# Patient Record
Sex: Male | Born: 1960 | Race: White | Hispanic: No | Marital: Single | Smoking: Never smoker
Health system: Southern US, Community
[De-identification: ages and names within clinical notes are randomized; demographics above are authoritative.]

## PROBLEM LIST (undated history)

## (undated) DIAGNOSIS — I4891 Unspecified atrial fibrillation: Secondary | ICD-10-CM

## (undated) HISTORY — PX: APPENDECTOMY: SHX54

## (undated) HISTORY — PX: TMJ ARTHROPLASTY: SHX1066

---

## 2011-09-22 ENCOUNTER — Observation Stay: Payer: Self-pay | Admitting: Surgery

## 2011-09-22 LAB — URINALYSIS, COMPLETE
Bacteria: NONE SEEN
Bilirubin,UR: NEGATIVE
Blood: NEGATIVE
Ph: 5 (ref 4.5–8.0)
Protein: NEGATIVE
RBC,UR: 2 /HPF (ref 0–5)
Squamous Epithelial: NONE SEEN
WBC UR: 3 /HPF (ref 0–5)

## 2011-09-22 LAB — COMPREHENSIVE METABOLIC PANEL
Albumin: 3.8 g/dL (ref 3.4–5.0)
BUN: 19 mg/dL — ABNORMAL HIGH (ref 7–18)
Calcium, Total: 8.7 mg/dL (ref 8.5–10.1)
Co2: 30 mmol/L (ref 21–32)
Creatinine: 1.18 mg/dL (ref 0.60–1.30)
EGFR (African American): >60
Glucose: 106 mg/dL — ABNORMAL HIGH (ref 65–99)
SGOT(AST): 12 U/L — ABNORMAL LOW (ref 15–37)
SGPT (ALT): 19 U/L
Sodium: 140 mmol/L (ref 136–145)
Total Protein: 7 g/dL (ref 6.4–8.2)

## 2011-09-22 LAB — CBC
HCT: 47.4 % (ref 40.0–52.0)
HGB: 16.5 g/dL (ref 13.0–18.0)
MCH: 32.4 pg (ref 26.0–34.0)
RBC: 5.08 10*6/uL (ref 4.40–5.90)
WBC: 11.6 10*3/uL — ABNORMAL HIGH (ref 3.8–10.6)

## 2011-09-26 LAB — PATHOLOGY REPORT

## 2011-10-02 ENCOUNTER — Ambulatory Visit: Payer: Self-pay | Admitting: Surgery

## 2012-10-18 ENCOUNTER — Emergency Department: Payer: Self-pay | Admitting: Emergency Medicine

## 2012-10-18 LAB — COMPREHENSIVE METABOLIC PANEL
Albumin: 4.1 g/dL (ref 3.4–5.0)
Alkaline Phosphatase: 76 U/L (ref 50–136)
Anion Gap: 8 (ref 7–16)
BUN: 19 mg/dL — ABNORMAL HIGH (ref 7–18)
Bilirubin,Total: 0.5 mg/dL (ref 0.2–1.0)
Chloride: 108 mmol/L — ABNORMAL HIGH (ref 98–107)
Co2: 24 mmol/L (ref 21–32)
EGFR (Non-African Amer.): 55 — ABNORMAL LOW
SGOT(AST): 20 U/L (ref 15–37)
Total Protein: 7 g/dL (ref 6.4–8.2)

## 2012-10-18 LAB — URINALYSIS, COMPLETE
Bacteria: NONE SEEN
Bilirubin,UR: NEGATIVE
Nitrite: NEGATIVE
Ph: 6 (ref 4.5–8.0)
Squamous Epithelial: NONE SEEN

## 2012-10-18 LAB — CBC
HGB: 15.7 g/dL (ref 13.0–18.0)
MCH: 30.9 pg (ref 26.0–34.0)
MCHC: 33.9 g/dL (ref 32.0–36.0)
MCV: 91 fL (ref 80–100)
Platelet: 193 10*3/uL (ref 150–440)
RBC: 5.08 10*6/uL (ref 4.40–5.90)
WBC: 12 10*3/uL — ABNORMAL HIGH (ref 3.8–10.6)

## 2014-04-20 IMAGING — CT CT STONE STUDY
1 of 2 series · 16 of 32 positions shown, 20 images · non-contrast
Comparison: none

REASON FOR EXAM: flank pain
COMMENTS:

PROCEDURE:     CT  - CT ABDOMEN /PELVIS WO (STONE)  - October 18, 2012  [DATE]
RESULT:     History: Flank pain.
Comparison Study: CT of 09/22/2011.

[Series 2: 3mm soft tissue · axial · 0.73mm/px · z∈[-1005,-543]mm · 16 of 169 slices shown, 20 images]
[im 8/169  soft-tissue]
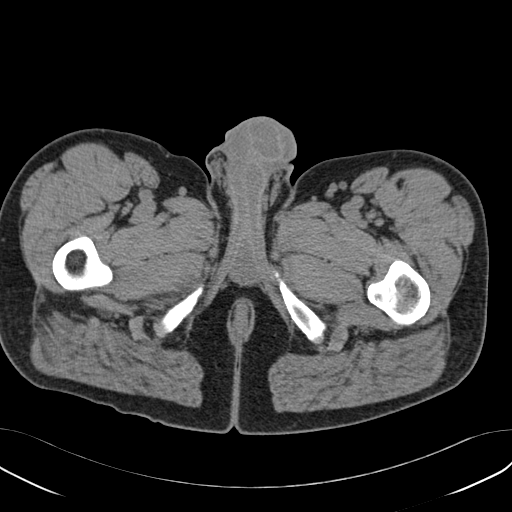
[im 8/169  bone]
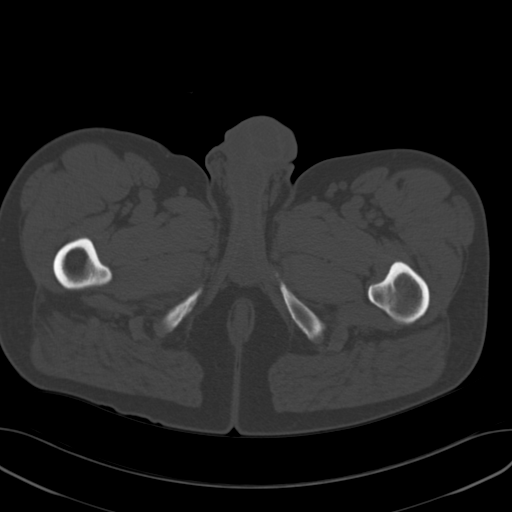
[im 22/169  soft-tissue]
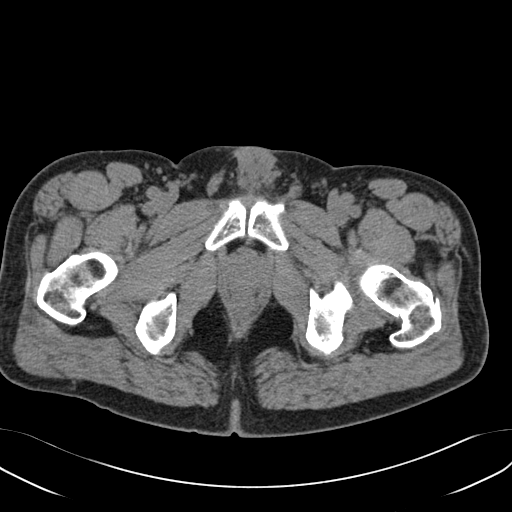
[im 36/169  soft-tissue]
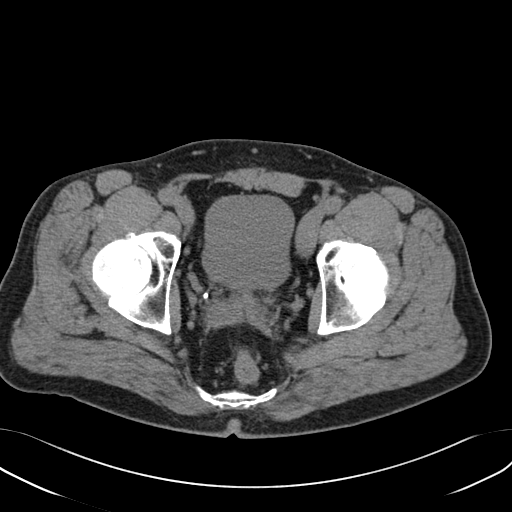
[im 43/169  soft-tissue]
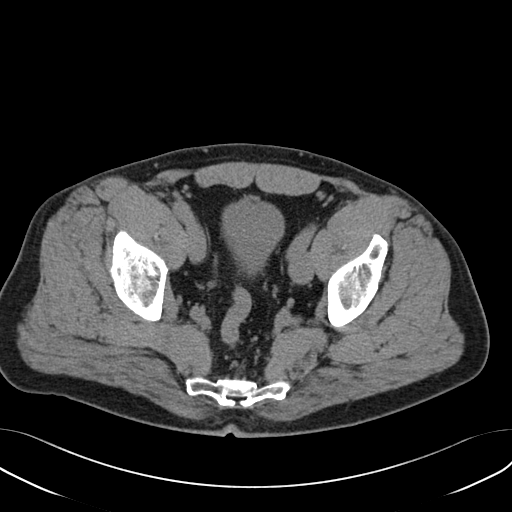
[im 57/169  soft-tissue]
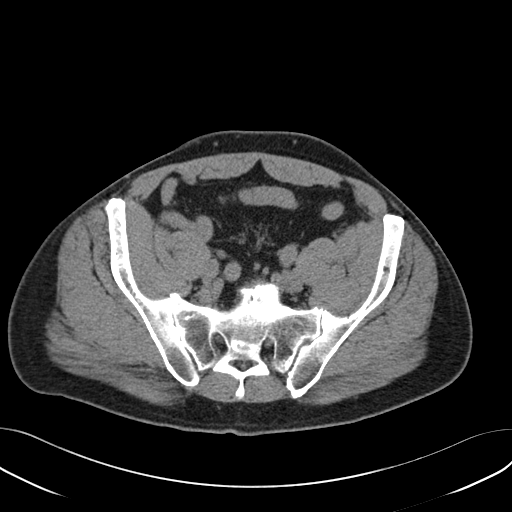
[im 71/169  soft-tissue]
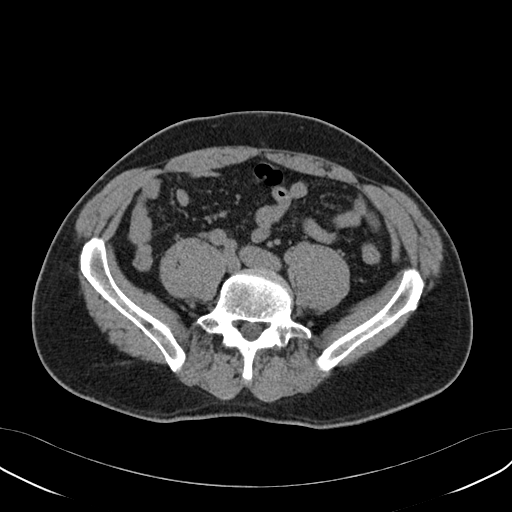
[im 78/169  soft-tissue]
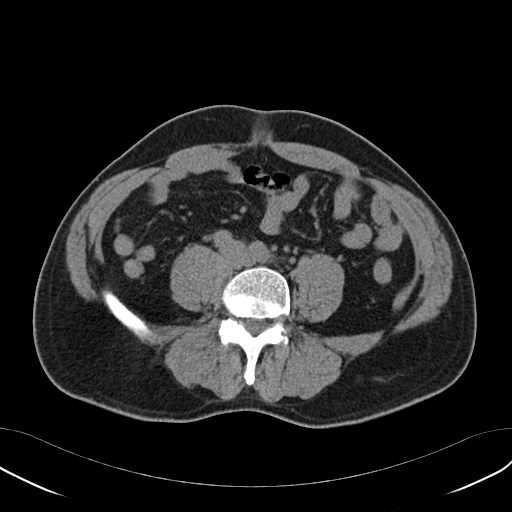
[im 92/169  soft-tissue]
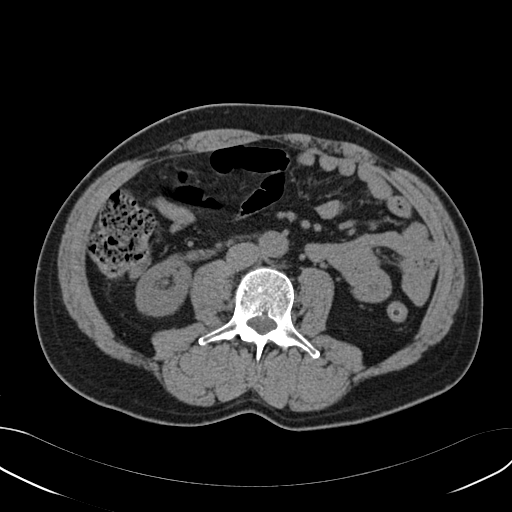
[im 99/169  soft-tissue]
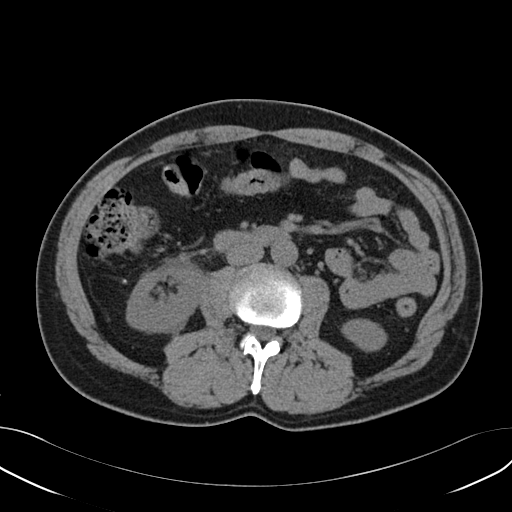
[im 99/169  bone]
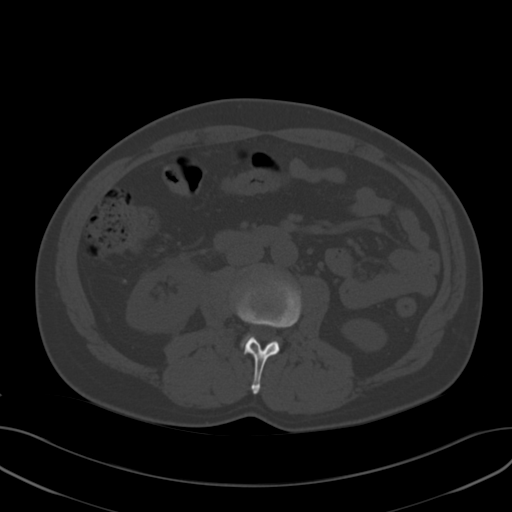
[im 113/169  soft-tissue]
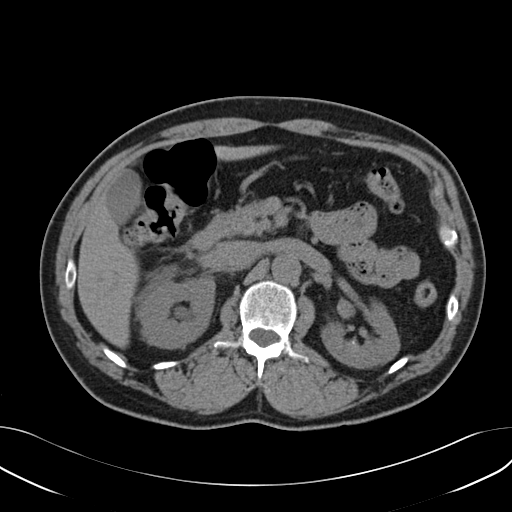
[im 127/169  soft-tissue]
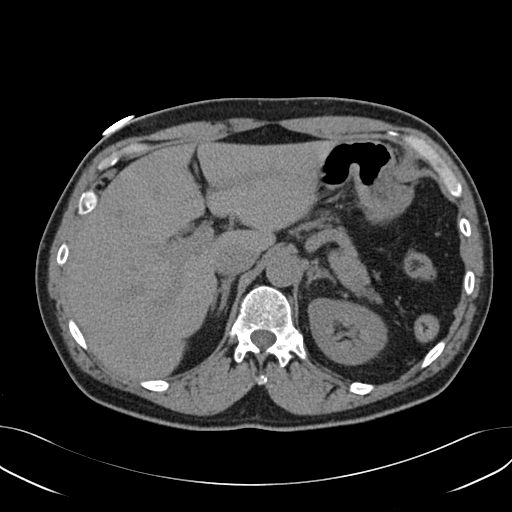
[im 134/169  soft-tissue]
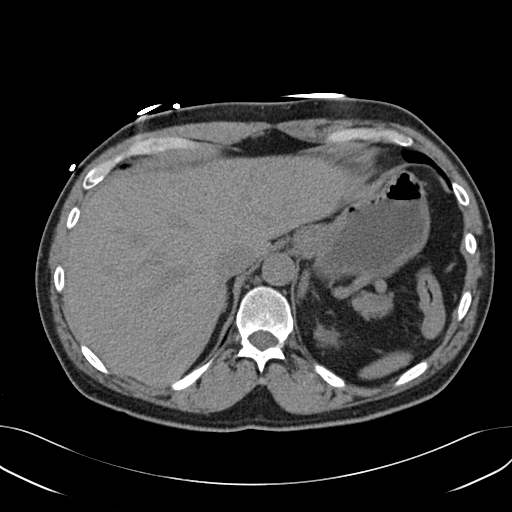
[im 141/169  lung]
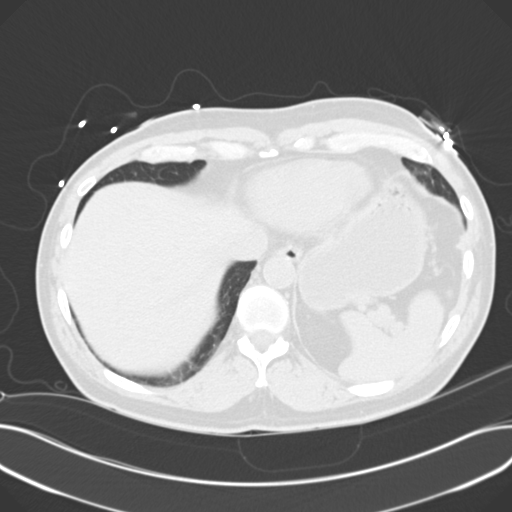
[im 148/169  soft-tissue]
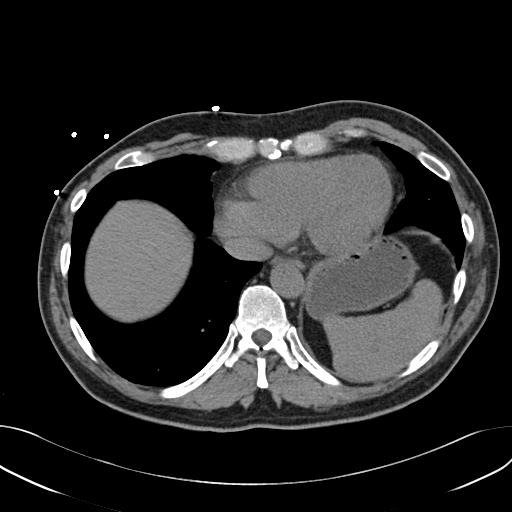
[im 148/169  lung]
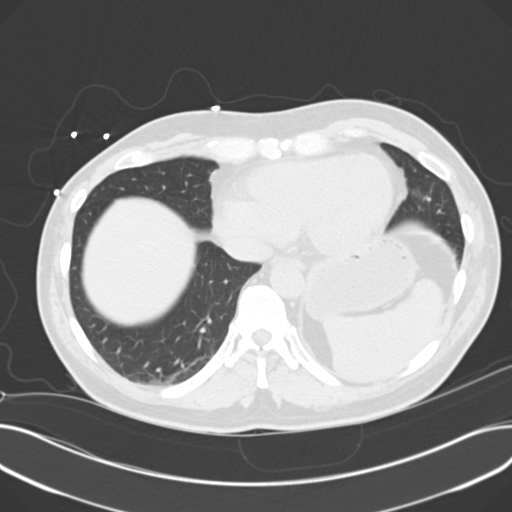
[im 155/169  lung]
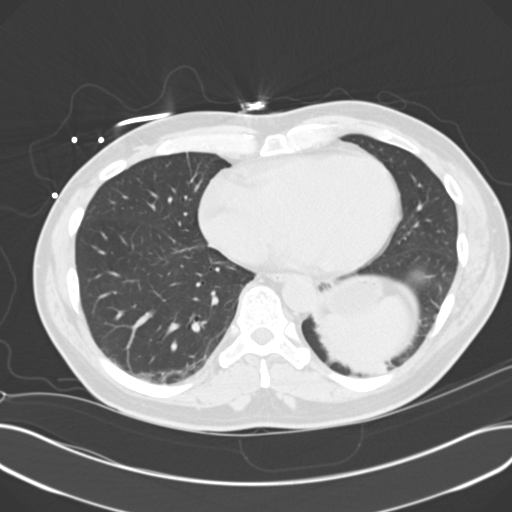
[im 162/169  soft-tissue]
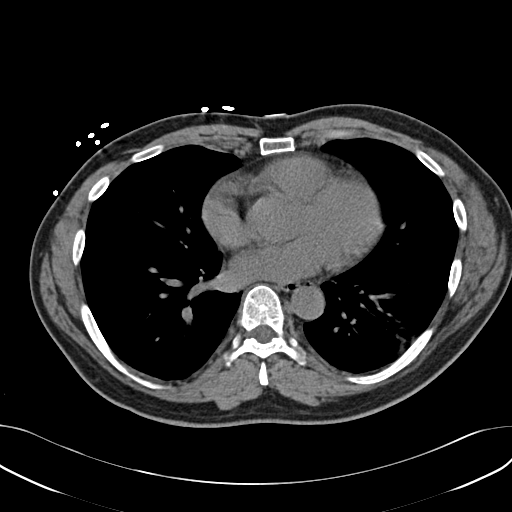
[im 162/169  lung]
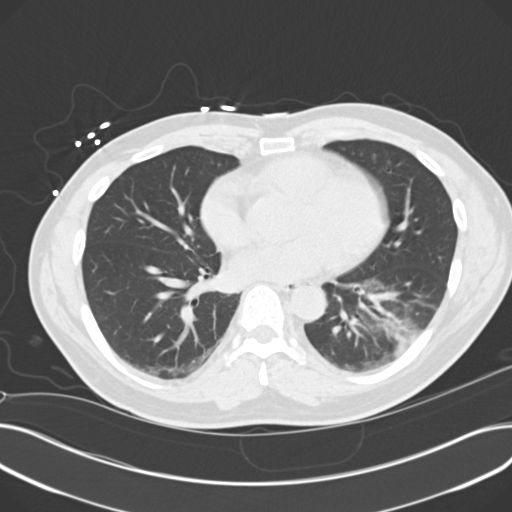

[16 of 32 positions shown; findings below may reference images not displayed]

FINDINGS: History: Flank pain.

Comparison Study: Prior CT of 09/22/2011.
FINDINGS: Standard nonenhanced CT obtained. Liver normal. Gallbladder
nondistended. Spleen normal. Pancreas normal. Adrenals normal. Prior
cholecystectomy. 2 mm stone in the distal right ureter with mild
hydronephrosis. Bladder is nondistended. Mild perirenal fluid on the right.
Aorta normal caliber. No bowel distention. Infiltrate noted left lung base.
IMPRESSION: 1. 2 mm stone distal right ureter on the right at the ureterovesical
junction. Minimal hydronephrosis present.
2. Left lower lobe pneumonia.

## 2015-01-02 NOTE — H&P (Signed)
  Subjective/Chief Complaint 2 days of abdominal pain    History of Present Illness 54 year old male with 2 days worsening abdominal pain fevers.  Symptoms started Thursday evening.  Unable to work Friday.  Temps at home to 100.8.  Mild nausea, significant anorexia.  Work-up in ER with leukocytosis and CT scan c/w acute appendicitis.    Past History paroxysmal atrial fibrillation.    Primary Physician non-local.   ALLERGIES:  Sulfa drugs: Unknown  Family and Social History:   Family History Non-Contributory    Social History negative tobacco, negative ETOH    Place of Living Home   Review of Systems:   Subjective/Chief Complaint as above    Abdominal Pain Yes    Nausea/Vomiting Yes    Tolerating Diet No  Nauseated  anorexia    Medications/Allergies Reviewed Medications/Allergies reviewed   Physical Exam:   GEN thin, disheveled, critically ill appearing, temp 99.2 hr 102/min.    HEENT pale conjunctivae    NECK supple    RESP normal resp effort  clear BS    CARD regular rate  no murmur  no JVD    ABD positive tenderness  no liver/spleen enlargement  no hernia  normal BS  significant rlq tenderness.    LYMPH negative neck    SKIN normal to palpation    NEURO cranial nerves intact    PSYCH A+O to time, place, person     Routine Chem:  12-Jan-13 08:40    Glucose, Serum 106   BUN 19   Creatinine (comp) 1.18   Sodium, Serum 140   Potassium, Serum 4.2   Chloride, Serum 102   CO2, Serum 30   Calcium (Total), Serum 8.7  Hepatic:  12-Jan-13 08:40    Bilirubin, Total 1.4   Alkaline Phosphatase 52   SGPT (ALT) 19   SGOT (AST) 12   Total Protein, Serum 7.0   Albumin, Serum 3.8  Routine Chem:  12-Jan-13 08:40    Osmolality (calc) 282   eGFR (African American) >60   eGFR (Non-African American) >60   Anion Gap 8  Routine Hem:  12-Jan-13 08:40    WBC (CBC) 11.6   RBC (CBC) 5.08   Hemoglobin (CBC) 16.5   Hematocrit (CBC) 47.4   Platelet Count (CBC)  212   MCV 93   MCH 32.4   MCHC 34.8   RDW 12.2  Routine Chem:  12-Jan-13 08:40    Lipase 55   Radiology Results: CT:    12-Jan-13 13:11, CT Abdomen and Pelvis With Contrast   CT Abdomen and Pelvis With Contrast   REASON FOR EXAM:    (1) RLQ pain; (2) RLQ pain  COMMENTS:       PROCEDURE: CT  - CT ABDOMEN / PELVIS  W  - Sep 22 2011  1:11PM     RESULT: Comparison:  None    Technique: Multiple axial images of the abdomen and pelvis were performed   from the lung bases to the pubic symphysis, with p.o. contrast and with   100 mL of Isovue 370 intravenous contrast.    Findings:  There are mild heterogeneous opacities in the lower lobes, left greater   than right. These may represent atelectasis. Infection is not excluded.   There is a 4 mm subpleural nodule in the right middle lobe on image 7.  There is mild dilatation of the intrahepatic biliary ducts. The common   bile duct measures 7-8 mm. The gallbladder, spleen, adrenals, and     pancreas are unremarkable. There is a 3 mm calculus in the inferior pole   of the right kidney. No hydronephrosis.    The small and large bowel are normal in caliber. The appendix is enlarged   and thickwalled. It measures 10-11 mm in diameter. There is adjacent   inflammatory stranding. No discrete extraluminal fluid collection. There   is mild bowel wall thickening of the terminal ileum, which may be   reactive.    No aggressive lytic or sclerotic osseous lesions are identified.    IMPRESSION:   1. Findings consistent with acute appendicitis.  2. Mild bowel wall thickening of the terminal ileum may be reactive.   However, an infectious or inflammatory enteritis is a differential   consideration.  3. Mild intrahepatic and extrahepatic biliary ductal dilatation. The   etiology of which is not apparent on this study. Further evaluation could   be provided with ERCP or M.R.C.P, as indicated.  4. Indeterminate 4 mm nodule in the right middle lobe.  If the patient is   at low risk for lung cancer, no further follow-upis recommended.  If the   patient is at high risk for lung cancer, recommend 12 month follow-up   noncontrast chest CT.    This was called to Dr. Lenise Arena at 1321 hours 09/22/2011.          Verified By: Gregor Hams, M.D., MD     Assessment/Admission Diagnosis 54 year old male with acute appendicitis    Plan npo, laparoscopic appendectomy.   Electronic Signatures: Sherri Rad (MD)  (Signed 12-Jan-13 14:33)  Authored: CHIEF COMPLAINT and HISTORY, ALLERGIES, HOME MEDICATIONS, FAMILY AND SOCIAL HISTORY, REVIEW OF SYSTEMS, PHYSICAL EXAM, LABS, Radiology, ASSESSMENT AND PLAN   Last Updated: 12-Jan-13 14:33 by Sherri Rad (MD)

## 2015-01-02 NOTE — Op Note (Signed)
PATIENT NAME:  Brandon Patton, Brandon Patton MR#:  [id number] DATE OF BIRTH:  [birth year]  DATE OF PROCEDURE:  09/22/2011  PREOPERATIVE DIAGNOSIS: Acute appendicitis.   POSTOPERATIVE DIAGNOSIS: Acute appendicitis.   PROCEDURE PERFORMED: Laparoscopic appendectomy.   SURGEON: Rollins A. Egbert GaribaldiBird, MD    ANESTHESIA: General endotracheal.   SPECIMENS: Appendix.   FINDINGS: Acute retrocecal appendicitis.   ESTIMATED BLOOD LOSS: Minimal.   DESCRIPTION OF PROCEDURE: With the patient in the supine position, general endotracheal anesthesia was induced. The patient's abdomen was clipped of hair, prepped and draped with ChloraPrep solution. SCDs were then applied. Perioperative antibiotics had been administered. Time-out procedure was observed. A 12 mm blunt Hassan trocar was placed through an open technique through an infraumbilical skin incision. Stay sutures were passed to the fascia. Pneumoperitoneum was established. A 12 mm Bladeless trocar was placed in the right upper quadrant. A 5 mm Bladeless trocar was placed in the suprapubic midline. The appendix was markedly inflamed and found to be in a retrocecal location and riding fairly high on the right lateral abdominal wall. The base of the appendix was dissected free with blunt technique and the base of the appendix was transected with a single fire of the blue load of the endoscopic 35 mm stapler. The mesentery was rotated medially with division of lateral attachments with blunt technique and the mesoappendix was taken with a single fire of the white load of the stapler. Specimen was captured in an EndoCatch device and retrieved. The right lower quadrant was irrigated with 1.5 liters of warm normal saline and aspirated dry. Point hemostasis was obtained in the epiploic fat pad was three hemoclips. With hemostasis being obtained on the operative field, the right upper quadrant port site was closed with an EndoStitch device. The infraumbilical fascial defect was closed with an  additional figure-of-eight #0 Vicryl suture in vertical orientation and the existing stay sutures tied to each other.   A total of 30 mL of 0.25% plain Marcaine was infiltrated along all skin and fascial incisions prior to closure. Benzoin, Steri-Strips, Telfa, and Tegaderm were then applied and the patient was then subsequently extubated and taken to the recovery room in stable and satisfactory condition by anesthesia services.   ____________________________ Redge GainerMark A. Egbert GaribaldiBird, MD mab:drc D: 09/22/2011 16:37:26 ET T: 09/23/2011 09:59:10 ET JOB#: [id number]  cc: Loraine LericheMark A. Egbert GaribaldiBird, MD, <Dictator> Raynald KempMARK A Onesimo Lingard MD ELECTRONICALLY SIGNED 09/28/2011 11:11

## 2015-11-23 ENCOUNTER — Other Ambulatory Visit: Payer: Self-pay | Admitting: Family Medicine

## 2015-11-23 DIAGNOSIS — N6320 Unspecified lump in the left breast, unspecified quadrant: Secondary | ICD-10-CM

## 2015-11-24 ENCOUNTER — Other Ambulatory Visit: Payer: Self-pay | Admitting: Family Medicine

## 2015-11-24 DIAGNOSIS — N63 Unspecified lump in unspecified breast: Secondary | ICD-10-CM

## 2015-12-05 ENCOUNTER — Ambulatory Visit
Admission: RE | Admit: 2015-12-05 | Discharge: 2015-12-05 | Disposition: A | Discharge status: Home / Self Care | Payer: BC Managed Care – PPO | Source: Ambulatory Visit | Attending: Family Medicine | Admitting: Family Medicine

## 2015-12-05 DIAGNOSIS — N6320 Unspecified lump in the left breast, unspecified quadrant: Secondary | ICD-10-CM

## 2015-12-05 DIAGNOSIS — N63 Unspecified lump in unspecified breast: Secondary | ICD-10-CM

## 2015-12-05 DIAGNOSIS — N62 Hypertrophy of breast: Secondary | ICD-10-CM | POA: Diagnosis not present

## 2020-04-12 ENCOUNTER — Other Ambulatory Visit: Payer: Self-pay

## 2020-04-12 ENCOUNTER — Inpatient Hospital Stay
Admission: EM | Admit: 2020-04-12 | Discharge: 2020-04-15 | DRG: 918 | Disposition: A | Discharge status: Home / Self Care | Payer: BC Managed Care – PPO | Attending: Student | Admitting: Student

## 2020-04-12 DIAGNOSIS — Y92009 Unspecified place in unspecified non-institutional (private) residence as the place of occurrence of the external cause: Secondary | ICD-10-CM

## 2020-04-12 DIAGNOSIS — I4891 Unspecified atrial fibrillation: Secondary | ICD-10-CM | POA: Diagnosis not present

## 2020-04-12 DIAGNOSIS — I480 Paroxysmal atrial fibrillation: Secondary | ICD-10-CM | POA: Diagnosis present

## 2020-04-12 DIAGNOSIS — W5911XA Bitten by nonvenomous snake, initial encounter: Secondary | ICD-10-CM | POA: Diagnosis present

## 2020-04-12 DIAGNOSIS — Z20822 Contact with and (suspected) exposure to covid-19: Secondary | ICD-10-CM | POA: Diagnosis present

## 2020-04-12 DIAGNOSIS — M7989 Other specified soft tissue disorders: Secondary | ICD-10-CM | POA: Diagnosis present

## 2020-04-12 DIAGNOSIS — M79641 Pain in right hand: Secondary | ICD-10-CM | POA: Diagnosis present

## 2020-04-12 DIAGNOSIS — T63081A Toxic effect of venom of other African and Asian snake, accidental (unintentional), initial encounter: Principal | ICD-10-CM | POA: Diagnosis present

## 2020-04-12 DIAGNOSIS — K5903 Drug induced constipation: Secondary | ICD-10-CM | POA: Diagnosis not present

## 2020-04-12 DIAGNOSIS — T402X5A Adverse effect of other opioids, initial encounter: Secondary | ICD-10-CM | POA: Diagnosis present

## 2020-04-12 HISTORY — DX: Unspecified atrial fibrillation: I48.91

## 2020-04-12 LAB — CBC WITH DIFFERENTIAL/PLATELET
Abs Immature Granulocytes: 0.03 10*3/uL (ref 0.00–0.07)
Abs Immature Granulocytes: 0.03 10*3/uL (ref 0.00–0.07)
Basophils Absolute: 0.1 10*3/uL (ref 0.0–0.1)
Basophils Absolute: 0 10*3/uL (ref 0.0–0.1)
Basophils Relative: 1 %
Basophils Relative: 0 %
Eosinophils Absolute: 0.2 10*3/uL (ref 0.0–0.5)
Eosinophils Absolute: 0.1 10*3/uL (ref 0.0–0.5)
Eosinophils Relative: 2 %
Eosinophils Relative: 1 %
HCT: 45.4 % (ref 39.0–52.0)
HCT: 43.1 % (ref 39.0–52.0)
Hemoglobin: 15.6 g/dL (ref 13.0–17.0)
Hemoglobin: 15.1 g/dL (ref 13.0–17.0)
Immature Granulocytes: 0 %
Immature Granulocytes: 0 %
Lymphocytes Relative: 19 %
Lymphocytes Relative: 8 %
Lymphs Abs: 1.4 10*3/uL (ref 0.7–4.0)
Lymphs Abs: 0.7 10*3/uL (ref 0.7–4.0)
MCH: 31.3 pg (ref 26.0–34.0)
MCH: 31.7 pg (ref 26.0–34.0)
MCHC: 34.4 g/dL (ref 30.0–36.0)
MCHC: 35 g/dL (ref 30.0–36.0)
MCV: 91 fL (ref 80.0–100.0)
MCV: 90.5 fL (ref 80.0–100.0)
Monocytes Absolute: 0.6 10*3/uL (ref 0.1–1.0)
Monocytes Absolute: 0.4 10*3/uL (ref 0.1–1.0)
Monocytes Relative: 8 %
Monocytes Relative: 4 %
Neutro Abs: 5.1 10*3/uL (ref 1.7–7.7)
Neutro Abs: 7.6 10*3/uL (ref 1.7–7.7)
Neutrophils Relative %: 70 %
Neutrophils Relative %: 87 %
Platelets: 218 10*3/uL (ref 150–400)
Platelets: 197 10*3/uL (ref 150–400)
RBC: 4.99 MIL/uL (ref 4.22–5.81)
RBC: 4.76 MIL/uL (ref 4.22–5.81)
RDW: 12.6 % (ref 11.5–15.5)
RDW: 12.6 % (ref 11.5–15.5)
WBC: 7.3 10*3/uL (ref 4.0–10.5)
WBC: 8.7 10*3/uL (ref 4.0–10.5)
nRBC: 0 % (ref 0.0–0.2)
nRBC: 0 % (ref 0.0–0.2)

## 2020-04-12 LAB — TYPE AND SCREEN
ABO/RH(D): O POS
Antibody Screen: NEGATIVE

## 2020-04-12 LAB — PROTIME-INR
INR: 0.9 (ref 0.8–1.2)
INR: 1.1 (ref 0.8–1.2)
INR: 1.1 (ref 0.8–1.2)
Prothrombin Time: 12.2 seconds (ref 11.4–15.2)
Prothrombin Time: 13.3 seconds (ref 11.4–15.2)
Prothrombin Time: 13.4 seconds (ref 11.4–15.2)

## 2020-04-12 LAB — COMPREHENSIVE METABOLIC PANEL
ALT: 22 U/L (ref 0–44)
AST: 18 U/L (ref 15–41)
Albumin: 4.7 g/dL (ref 3.5–5.0)
Alkaline Phosphatase: 79 U/L (ref 38–126)
Anion gap: 10 (ref 5–15)
BUN: 21 mg/dL — ABNORMAL HIGH (ref 6–20)
CO2: 24 mmol/L (ref 22–32)
Calcium: 9 mg/dL (ref 8.9–10.3)
Chloride: 106 mmol/L (ref 98–111)
Creatinine, Ser: 1.02 mg/dL (ref 0.61–1.24)
GFR calc Af Amer: >60 mL/min (ref >60)
GFR calc non Af Amer: >60 mL/min (ref >60)
Glucose, Bld: 103 mg/dL — ABNORMAL HIGH (ref 70–99)
Potassium: 4 mmol/L (ref 3.5–5.1)
Sodium: 140 mmol/L (ref 135–145)
Total Bilirubin: 0.8 mg/dL (ref 0.3–1.2)
Total Protein: 7.1 g/dL (ref 6.5–8.1)

## 2020-04-12 LAB — SARS CORONAVIRUS 2 BY RT PCR (HOSPITAL ORDER, PERFORMED IN ~~LOC~~ HOSPITAL LAB): SARS Coronavirus 2: NEGATIVE

## 2020-04-12 LAB — FIBRINOGEN
Fibrinogen: 461 mg/dL (ref 210–475)
Fibrinogen: 385 mg/dL (ref 210–475)

## 2020-04-12 LAB — LACTATE DEHYDROGENASE: LDH: 139 U/L (ref 98–192)

## 2020-04-12 LAB — APTT
aPTT: 24 seconds (ref 24–36)
aPTT: <24 seconds — ABNORMAL LOW (ref 24–36)

## 2020-04-12 MED ORDER — MORPHINE SULFATE (PF) 4 MG/ML IV SOLN
4.0000 mg | INTRAVENOUS | Status: DC | PRN
  Administered 2020-04-12: 4 mg via INTRAVENOUS
  Filled 2020-04-12: qty 1

## 2020-04-12 MED ORDER — HYDROMORPHONE HCL 1 MG/ML IJ SOLN
1.0000 mg | INTRAMUSCULAR | Status: DC | PRN
  Administered 2020-04-12 – 2020-04-15 (×14): 1 mg via INTRAVENOUS
  Filled 2020-04-12 (×14): qty 1

## 2020-04-12 MED ORDER — HYDROMORPHONE HCL 1 MG/ML IJ SOLN
0.5000 mg | Freq: Once | INTRAMUSCULAR | Status: AC
  Administered 2020-04-12: 0.5 mg via INTRAVENOUS
  Filled 2020-04-12: qty 1

## 2020-04-12 MED ORDER — MORPHINE SULFATE (PF) 4 MG/ML IV SOLN: 4.0000 mg | Freq: Once | INTRAVENOUS | Status: DC

## 2020-04-12 MED ORDER — SODIUM CHLORIDE 0.9 % IV SOLN: INTRAVENOUS | Status: DC

## 2020-04-12 NOTE — ED Notes (Signed)
Pt reports pain radiating up arm at this time

## 2020-04-12 NOTE — ED Triage Notes (Signed)
Pt arrives via pov. Pt reports obtaining snake bite by african bush viper on right hand index finger at 1630 today. Area red swollen and warm to touch. Cap refill WNL. Pt reports hx of multiple episodes of afib in past, pt take diltiazem and flecanide. Pt a&o on arrival NAD noted at this time

## 2020-04-12 NOTE — ED Notes (Signed)
Poison control recommends 24 hr obs with repeat coag q6, more frequently if changes noted. Recommended monitor for bleeding and treat with ffp or blood products if needed.  Also recommended against morphine due to increased histamine release, and recommends against NSAIDS due to increased bleeding risk

## 2020-04-12 NOTE — ED Notes (Signed)
Pt reports throbbing has transitioned from just finger to entire hand. MD notified, orders received

## 2020-04-12 NOTE — ED Provider Notes (Signed)
[facility] Emergency Department Provider Note   ____________________________________________    I have reviewed the triage vital signs and the nursing notes.   HISTORY  Chief Complaint Snake Bite    HPI Brandon Patton is a 59 y.o. male with a history of atrial fibrillation who presents after snakebite to the right hand by an African Bush viper.  Patient was apparently trying to take a picture of this exotic species that he keeps and it bit him.  It was an adult.  He does complain of pain and swelling to the area where he was bitten.  Denies shortness of breath, no nausea or vomiting.  Bite happened at 4:30 PM   Past Medical History:  Diagnosis Date  . A-fib (HCC)     There are no problems to display for this patient.   Past Surgical History:  Procedure Laterality Date  . APPENDECTOMY    . TMJ ARTHROPLASTY      Prior to Admission medications   Not on File     Allergies Penicillins and Sulfa antibiotics  Family History  Problem Relation Age of Onset  . Breast cancer Paternal Aunt   . Breast cancer Paternal Grandmother   . Breast cancer Paternal Aunt     Social History Social History   Tobacco Use  . Smoking status: Never Smoker  . Smokeless tobacco: Never Used  Substance Use Topics  . Alcohol use: Never  . Drug use: Never    Review of Systems  Constitutional: No fever/chills Eyes: No visual changes.  ENT: No sore throat. Cardiovascular: Denies chest pain. Respiratory: Denies shortness of breath. Gastrointestinal: No abdominal pain.   Genitourinary: Negative for dysuria. Musculoskeletal: Negative for back pain. Skin: As above Neurological: Negative for headaches or weakness   ____________________________________________   PHYSICAL EXAM:  VITAL SIGNS: ED Triage Vitals  Enc Vitals Group     BP 04/12/20 1718 (!) 136/103     Pulse Rate 04/12/20 1718 100     Resp 04/12/20 1718 13     Temp 04/12/20 1718 98 F  (36.7 C)     Temp Source 04/12/20 1718 Oral     SpO2 04/12/20 1718 94 %     Weight 04/12/20 1720 81.6 kg (180 lb)     Height 04/12/20 1720 1.778 m (5\' 10" )     Head Circumference --      Peak Flow --      Pain Score 04/12/20 1719 8     Pain Loc --      Pain Edu? --      Excl. in GC? --     Constitutional: Alert and oriented.   Nose: No congestion/rhinnorhea. Mouth/Throat: Mucous membranes are moist.   Neck:  Painless ROM Cardiovascular:  Good peripheral circulation. Respiratory: Normal respiratory effort.  No retractions. Gastrointestinal: Soft and nontender. No distention.   Musculoskeletal:  Warm and well perfused Neurologic:  Normal speech and language. No gross focal neurologic deficits are appreciated.  Skin:  Skin is warm, dry.  06/12/20 marks with swelling of the base of the right index finger on the dorsal aspect Psychiatric: Mood and affect are normal. Speech and behavior are normal.  ____________________________________________   LABS (all labs ordered are listed, but only abnormal results are displayed)  Labs Reviewed  COMPREHENSIVE METABOLIC PANEL - Abnormal; Notable for the following components:      Result Value   Glucose, Bld 103 (*)    BUN 21 (*)  All other components within normal limits  SARS CORONAVIRUS 2 BY RT PCR (HOSPITAL ORDER, PERFORMED IN [facility])  CBC WITH DIFFERENTIAL/PLATELET  PROTIME-INR  FIBRINOGEN  APTT  LACTATE DEHYDROGENASE  CBC WITH DIFFERENTIAL/PLATELET  CBC WITH DIFFERENTIAL/PLATELET  PROTIME-INR  PROTIME-INR  FIBRINOGEN  FIBRINOGEN   ____________________________________________  EKG  ED ECG REPORT I, Jene Every, the attending physician, personally viewed and interpreted this ECG.  Date: 04/12/2020  Rhythm: Sinus tachycardia QRS Axis: normal Intervals: normal ST/T Wave abnormalities: normal Narrative Interpretation: no evidence of acute  ischemia  ____________________________________________  RADIOLOGY  None ____________________________________________   PROCEDURES  Procedure(s) performed: No  Procedures   Critical Care performed: No ____________________________________________   INITIAL IMPRESSION / ASSESSMENT AND PLAN / ED COURSE  Pertinent labs & imaging results that were available during my care of the patient were reviewed by me and considered in my medical decision making (see chart for details).  Patient presents after snakebite by African Bush viper.  There is no known antivenom for this.  Patient does have pronounced of recommended treatments which are primarily supportive.  I have drawn labs, placed IV, IV morphine for pain as needed.  IV fluids infusing.  Have discussed with poison control center, they are consulting toxicology to see if there are any antivenom's that would be effective in the area.  They recommend admission for 24-hour evaluation  Patient will require admission for observation given the hemotoxic nature of this event him    ____________________________________________   FINAL CLINICAL IMPRESSION(S) / ED DIAGNOSES  Final diagnoses:  Snake bite, initial encounter        Note:  This document was prepared using Dragon voice recognition software and may include unintentional dictation errors.   Jene Every, MD 04/12/20 Zollie Pee

## 2020-04-12 NOTE — ED Notes (Signed)
Poison control contacted at this time

## 2020-04-12 NOTE — H&P (Signed)
History and Physical   [name] QBH:[id number] DOB: 1961/09/05 DOA: 04/12/2020  Referring MD/NP/PA: Dr. Cyril Loosen  PCP: Myrla Halsted, MD   Outpatient Specialists: None  Patient coming from: Home  Chief Complaint: Snakebite  HPI: Brandon Patton is a 59 y.o. male with medical history significant of atrial fibrillation, previous appendectomy, who presented to the ER today after has sustaining snakebite by an African boy BiPAP.  This is negative was apparently part of an exotic pet at her residence.  Patient was trying to take pictures of the snake when he suddenly bit him in the right hand.  The significance neck bites marks on the right index finger.  The whole hand is swollen in the moment.  Patient came to the ER where he was seen and evaluated.  No bleeding episode no nausea vomiting or diarrhea.  Poison control has been contacted.  There is no significant [place] available.  Patient will not respond to [employer].  Is being admitted for at least 24-hour observation and further treatment..  ED Course: Vitals are stable.  Labs ulcers mostly stable.  PT 12.2 INR 0.9..  Repeat INR is 1.1.  Patient will be admitted with plan to get intervened on.  Review of Systems: As per HPI otherwise 10 point review of systems negative.    Past Medical History:  Diagnosis Date  . A-fib [facility])     Past Surgical History:  Procedure Laterality Date  . APPENDECTOMY    . TMJ ARTHROPLASTY       reports that he has never smoked. He has never used smokeless tobacco. He reports that he does not drink alcohol and does not use drugs.  Allergies  Allergen Reactions  . Penicillins   . Sulfa Antibiotics Other (See Comments)    blisters    Family History  Problem Relation Age of Onset  . Breast cancer Paternal Aunt   . Breast cancer Paternal Grandmother   . Breast cancer Paternal Aunt      Prior to Admission medications   Medication Sig Start Date End Date Taking? Authorizing Provider  aspirin  81 MG chewable tablet Chew 81 mg by mouth daily. 01/29/17  Yes [provider]  atorvastatin (LIPITOR) 40 MG tablet Take 40 mg by mouth daily. 01/22/20   [provider]  flecainide (TAMBOCOR) 50 MG tablet Take 50 mg by mouth every 8 (eight) hours as needed. 03/11/20   [provider]  Multiple Vitamin (MULTI-VITAMIN) tablet Take 1 tablet by mouth daily.    [provider]  tadalafil (CIALIS) 20 MG tablet Take 20 mg by mouth daily as needed for erectile dysfunction. 03/18/20   [provider]  TIADYLT ER 120 MG 24 hr capsule Take 120 mg by mouth daily. 01/22/20   [provider]    Physical Exam: Vitals:   04/12/20 1718 04/12/20 1720  BP: (!) 136/103   Pulse: 100   Resp: 13   Temp: 98 F (36.7 C)   TempSrc: Oral   SpO2: 94%   Weight:  81.6 kg  Height:  5\' 10"  (1.778 m)      Constitutional: Acutely ill looking, withdrawn Vitals:   04/12/20 1718 04/12/20 1720  BP: (!) 136/103   Pulse: 100   Resp: 13   Temp: 98 F (36.7 C)   TempSrc: Oral   SpO2: 94%   Weight:  81.6 kg  Height:  5\' 10"  (1.778 m)   Eyes: PERRL, lids and conjunctivae normal ENMT: Mucous membranes are  moist. Posterior pharynx clear of any exudate or lesions.Normal dentition.  Neck: normal, supple, no masses, no thyromegaly Respiratory: clear to auscultation bilaterally, no wheezing, no crackles. Normal respiratory effort. No accessory muscle use.  Cardiovascular: Regular rate and rhythm, no murmurs / rubs / gallops. No extremity edema. 2+ pedal pulses. No carotid bruits.  Abdomen: no tenderness, no masses palpated. No hepatosplenomegaly. Bowel sounds positive.  Musculoskeletal: no clubbing / cyanosis. No joint deformity upper and lower extremities.  Right hand swollen warm inflamed around the right index finger.  Skin: no rashes, lesions, ulcers. No induration Neurologic: CN 2-12 grossly intact. Sensation intact, DTR normal. Strength 5/5 in all 4.  Psychiatric:  Normal judgment and insight. Alert and oriented x 3. Normal mood.     Labs on Admission: I have personally reviewed following labs and imaging studies  CBC: Recent Labs  Lab 04/12/20 1728  WBC 7.3  NEUTROABS 5.1  HGB 15.6  HCT 45.4  MCV 91.0  PLT 218   Basic Metabolic Panel: Recent Labs  Lab 04/12/20 1728  NA 140  K 4.0  CL 106  CO2 24  GLUCOSE 103*  BUN 21*  CREATININE 1.02  CALCIUM 9.0   GFR: Estimated Creatinine Clearance: 80.5 mL/min (by C-G formula based on SCr of 1.02 mg/dL). Liver Function Tests: Recent Labs  Lab 04/12/20 1728  AST 18  ALT 22  ALKPHOS 79  BILITOT 0.8  PROT 7.1  ALBUMIN 4.7   No results for input(s): LIPASE, AMYLASE in the last 168 hours. No results for input(s): AMMONIA in the last 168 hours. Coagulation Profile: Recent Labs  Lab 04/12/20 1728  INR 0.9   Cardiac Enzymes: No results for input(s): CKTOTAL, CKMB, CKMBINDEX, TROPONINI in the last 168 hours. BNP (last 3 results) No results for input(s): PROBNP in the last 8760 hours. HbA1C: No results for input(s): HGBA1C in the last 72 hours. CBG: No results for input(s): GLUCAP in the last 168 hours. Lipid Profile: No results for input(s): CHOL, HDL, LDLCALC, TRIG, CHOLHDL, LDLDIRECT in the last 72 hours. Thyroid Function Tests: No results for input(s): TSH, T4TOTAL, FREET4, T3FREE, THYROIDAB in the last 72 hours. Anemia Panel: No results for input(s): VITAMINB12, FOLATE, FERRITIN, TIBC, IRON, RETICCTPCT in the last 72 hours. Urine analysis:    Component Value Date/Time   COLORURINE Colorless 10/18/2012 1125   APPEARANCEUR Clear 10/18/2012 1125   LABSPEC 1.025 10/18/2012 1125   PHURINE 6.0 10/18/2012 1125   GLUCOSEU Negative 10/18/2012 1125   HGBUR 2+ 10/18/2012 1125   BILIRUBINUR Negative 10/18/2012 1125   KETONESUR Negative 10/18/2012 1125   PROTEINUR Negative 10/18/2012 1125   NITRITE Negative 10/18/2012 1125   LEUKOCYTESUR Negative 10/18/2012 1125   Sepsis  Labs: @LABRCNTIP (procalcitonin:4,lacticidven:4) )No results found for this or any previous visit (from the past 240 hour(s)).   Radiological Exams on Admission: No results found.  EKG: Independently reviewed.  Normal sinus rhythm no significant ST changes  Assessment/Plan Principal Problem:   Bite, snake Active Problems:   A-fib (HCC)     #1 snakebite: Bitten by Bush viper.  Patient will be admitted.  Pain control.  Elevation of the foot.  There is no specific antivenom although through poison control hepatologist and toxicologist have been contacted.  There seems to be suggestion of an anti venom that may walk against this poison.  We have discussed with poison control and apparently the main concern is coagulopathy.  There is the suggestion of a "Samr echiscarinatus antivenom" available only at [facility], the closest one to [place] being in [place].  There is a contact person named Janus Molder who is the hematologist that have access to 5 vials of the antivenom.  Her telephone number is [phone] or cell phone [phone]. recommendation is if INR appears to be getting worse we will obtain these vials to be on the safe side and administer to the patient.  If we do not use it we will then send it back to the hepatologist.    #2 history of atrial fibrillation: Has been in sinus rhythm at this point.  Continue monitor   DVT prophylaxis: SCD Code Status: Full code Family Communication: No family at bedside Disposition Plan: Home Consults called: Poison control Admission status: Inpatient  Severity of Illness: The appropriate patient status for this patient is INPATIENT. Inpatient status is judged to be reasonable and necessary in order to provide the required intensity of service to ensure the patient's safety. The patient's presenting symptoms, physical exam findings, and initial radiographic and laboratory data in the context of their chronic comorbidities is  felt to place them at high risk for further clinical deterioration. Furthermore, it is not anticipated that the patient will be medically stable for discharge from the hospital within 2 midnights of admission. The following factors support the patient status of inpatient.   " The patient's presenting symptoms include snakebite. " The worrisome physical exam findings include markedly elevated and swollen right hand. " The initial radiographic and laboratory data are worrisome because of no significant derangement. " The chronic co-morbidities include history of A. fib.   * I certify that at the point of admission it is my clinical judgment that the patient will require inpatient hospital care spanning beyond 2 midnights from the point of admission due to high intensity of service, high risk for further deterioration and high frequency of surveillance required.Lonia Blood MD [facility] Pager [phone]  If 7PM-7AM, please contact night-coverage [email or web address] Password [email or web address]  04/12/2020, 7:34 PM

## 2020-04-13 DIAGNOSIS — I480 Paroxysmal atrial fibrillation: Secondary | ICD-10-CM

## 2020-04-13 LAB — FIBRINOGEN
Fibrinogen: 306 mg/dL (ref 210–475)
Fibrinogen: 222 mg/dL (ref 210–475)
Fibrinogen: 181 mg/dL — ABNORMAL LOW (ref 210–475)
Fibrinogen: 141 mg/dL — ABNORMAL LOW (ref 210–475)
Fibrinogen: 105 mg/dL — ABNORMAL LOW (ref 210–475)

## 2020-04-13 LAB — CBC WITH DIFFERENTIAL/PLATELET
Abs Immature Granulocytes: 0.04 10*3/uL (ref 0.00–0.07)
Basophils Absolute: 0 10*3/uL (ref 0.0–0.1)
Basophils Relative: 0 %
Eosinophils Absolute: 0 10*3/uL (ref 0.0–0.5)
Eosinophils Relative: 0 %
HCT: 41.1 % (ref 39.0–52.0)
Hemoglobin: 14.7 g/dL (ref 13.0–17.0)
Immature Granulocytes: 0 %
Lymphocytes Relative: 4 %
Lymphs Abs: 0.5 10*3/uL — ABNORMAL LOW (ref 0.7–4.0)
MCH: 32 pg (ref 26.0–34.0)
MCHC: 35.8 g/dL (ref 30.0–36.0)
MCV: 89.5 fL (ref 80.0–100.0)
Monocytes Absolute: 1 10*3/uL (ref 0.1–1.0)
Monocytes Relative: 8 %
Neutro Abs: 10.6 10*3/uL — ABNORMAL HIGH (ref 1.7–7.7)
Neutrophils Relative %: 88 %
Platelets: 174 10*3/uL (ref 150–400)
RBC: 4.59 MIL/uL (ref 4.22–5.81)
RDW: 13 % (ref 11.5–15.5)
WBC: 12.1 10*3/uL — ABNORMAL HIGH (ref 4.0–10.5)
nRBC: 0 % (ref 0.0–0.2)

## 2020-04-13 LAB — PROTIME-INR
INR: 1.2 (ref 0.8–1.2)
INR: 1.1 (ref 0.8–1.2)
INR: 1.2 (ref 0.8–1.2)
INR: 1.2 (ref 0.8–1.2)
INR: 1.2 (ref 0.8–1.2)
Prothrombin Time: 14.3 seconds (ref 11.4–15.2)
Prothrombin Time: 14 seconds (ref 11.4–15.2)
Prothrombin Time: 14.3 seconds (ref 11.4–15.2)
Prothrombin Time: 14.6 seconds (ref 11.4–15.2)
Prothrombin Time: 14.6 seconds (ref 11.4–15.2)

## 2020-04-13 LAB — CBC
HCT: 38.4 % — ABNORMAL LOW (ref 39.0–52.0)
HCT: 38.8 % — ABNORMAL LOW (ref 39.0–52.0)
HCT: 39.4 % (ref 39.0–52.0)
Hemoglobin: 13.7 g/dL (ref 13.0–17.0)
Hemoglobin: 13.1 g/dL (ref 13.0–17.0)
Hemoglobin: 13.4 g/dL (ref 13.0–17.0)
MCH: 32.2 pg (ref 26.0–34.0)
MCH: 31.3 pg (ref 26.0–34.0)
MCH: 31.7 pg (ref 26.0–34.0)
MCHC: 35.7 g/dL (ref 30.0–36.0)
MCHC: 33.8 g/dL (ref 30.0–36.0)
MCHC: 34 g/dL (ref 30.0–36.0)
MCV: 90.1 fL (ref 80.0–100.0)
MCV: 92.6 fL (ref 80.0–100.0)
MCV: 93.1 fL (ref 80.0–100.0)
Platelets: 167 10*3/uL (ref 150–400)
Platelets: 166 10*3/uL (ref 150–400)
Platelets: 170 10*3/uL (ref 150–400)
RBC: 4.26 MIL/uL (ref 4.22–5.81)
RBC: 4.19 MIL/uL — ABNORMAL LOW (ref 4.22–5.81)
RBC: 4.23 MIL/uL (ref 4.22–5.81)
RDW: 13.1 % (ref 11.5–15.5)
RDW: 13.2 % (ref 11.5–15.5)
RDW: 13.2 % (ref 11.5–15.5)
WBC: 8.6 10*3/uL (ref 4.0–10.5)
WBC: 6.4 10*3/uL (ref 4.0–10.5)
WBC: 7.5 10*3/uL (ref 4.0–10.5)
nRBC: 0 % (ref 0.0–0.2)
nRBC: 0 % (ref 0.0–0.2)
nRBC: 0 % (ref 0.0–0.2)

## 2020-04-13 LAB — APTT
aPTT: <24 seconds — ABNORMAL LOW (ref 24–36)
aPTT: <24 seconds — ABNORMAL LOW (ref 24–36)
aPTT: 26 seconds (ref 24–36)
aPTT: 28 seconds (ref 24–36)

## 2020-04-13 LAB — HIV ANTIBODY (ROUTINE TESTING W REFLEX): HIV Screen 4th Generation wRfx: NONREACTIVE

## 2020-04-13 MED ORDER — DILTIAZEM HCL ER BEADS 120 MG PO CP24
120.0000 mg | ORAL_CAPSULE | Freq: Every day | ORAL | Status: DC
  Administered 2020-04-14 – 2020-04-15 (×2): 120 mg via ORAL
  Filled 2020-04-13 (×2): qty 1

## 2020-04-13 MED ORDER — ONDANSETRON HCL 4 MG/2ML IJ SOLN
4.0000 mg | Freq: Three times a day (TID) | INTRAMUSCULAR | Status: DC | PRN
  Administered 2020-04-13 – 2020-04-14 (×2): 4 mg via INTRAVENOUS
  Filled 2020-04-13 (×2): qty 2

## 2020-04-13 NOTE — Progress Notes (Signed)
PROGRESS NOTE  Brandon Patton ENI:[id number] DOB: [birth year]   PCP: Myrla Halsted, MD  Patient is from: Home  DOA: 04/12/2020 LOS: 1  Brief Narrative / Interim history: 59 year old male with history of A. 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  Subsequently, he developed hand swelling and pain.  Cardiac labs within normal range.  Poison control contacted by admitting provider and EDP.  Per poison control, patient will not respond to CroFab.    Subjective: Seen and examined earlier this morning.  No major events overnight or this morning.  Still with significant pain but improved response to Dilaudid lately.  Swelling in his right hand improved but slightly increased swelling in his forearm.  Denies numbness or tingling.  Denies cardiopulmonary or GI symptoms.  Objective: Vitals:   04/12/20 2040 04/13/20 0026 04/13/20 0449 04/13/20 0726  BP:  115/86 105/78 108/80  Pulse:  90 82 79  Resp:  16 16 16   Temp:  97.7 F (36.5 C) 97.8 F (36.6 C) 98 F (36.7 C)  TempSrc:  Oral Oral Oral  SpO2:  96% 95% 95%  Weight: 83.7 kg     Height: 5\' 10"  (1.778 m)       Intake/Output Summary (Last 24 hours) at 04/13/2020 1426 Last data filed at 04/13/2020 1336 Gross per 24 hour  Intake 1989.13 ml  Output --  Net 1989.13 ml   Filed Weights   04/12/20 1720 04/12/20 2040  Weight: 81.6 kg 83.7 kg    Examination:  GENERAL: No apparent distress.  Nontoxic. HEENT: MMM.  Vision and hearing grossly intact.  NECK: Supple.  No apparent JVD.  RESP:  No IWOB.  Fair aeration bilaterally. CVS:  RRR. Heart sounds normal.  ABD/GI/GU: BS+. Abd soft, NTND.  MSK/EXT:  Moves extremities.  Right hand and right forearm swelling.  No signs of compartment syndrome. SKIN: no apparent skin lesion or wound NEURO: Awake, alert and oriented appropriately.  No apparent focal neuro deficit. PSYCH: Calm. Normal affect.  Procedures:   None  Microbiology summarized: COVID-19 PCR negative.  Assessment & Plan: Snakebite to right hand by African Bush viper.  Reportedly, no specific antivenom although through poison control hepatologist and toxicologist have been contacted.  There seems to be suggestion of an anti venom that may be considered.  "Samr echiscarinatus antivenom" available only at [facility], the closest one to 06/12/20 being in [facility].  There is a contact person named [place] who is the hematologist that have access to 5 vials of the antivenom.  Her telephone number is [phone] or cell phone [phone]. Recommendation is if INR appears to be getting worse we will obtain these vials to be on the safe side and administer to the patient.  If we do not use it we will then send it back to the hepatologist.  So far, CBC and coag labs within normal range except for some downtrend in fibrinogen. -Continue trending coag labs, fibrinogen and CBC every 6 hours -Monitor clinically for increased swelling -Pain control -Continue hand/arm elevation.  Paroxysmal A. fib: Tiadylt ER 120 mg, flecainide and low-dose aspirin at home. -Continue home Tiadylt ER 120 mg and flecainide. -Hold home aspirin   Body mass index is 26.49 kg/m.         DVT prophylaxis:  SCDs Start: 04/12/20 2022  Code Status: Full code Family Communication: Patient's wife at bedside. Status is: Inpatient  Remains inpatient appropriate because:Ongoing diagnostic testing needed not appropriate for outpatient work up, Inpatient level of care appropriate due to severity of illness and Close monitoring for coagulopathy and swelling   Dispo: The patient is from: Home              Anticipated d/c is to: Home              Anticipated d/c date is: 1 day              Patient currently is not medically stable to d/c.       Consultants:  Poison control   Sch Meds:  Scheduled Meds: Continuous Infusions: . sodium chloride 125  mL/hr at 04/13/20 1339   PRN Meds:.HYDROmorphone (DILAUDID) injection, morphine injection  Antimicrobials: Anti-infectives (From admission, onward)   None       I have personally reviewed the following labs and images: CBC: Recent Labs  Lab 04/12/20 1728 04/12/20 2029 04/13/20 0053 04/13/20 0537  WBC 7.3 8.7 12.1* 8.6  NEUTROABS 5.1 7.6 10.6*  --   HGB 15.6 15.1 14.7 13.7  HCT 45.4 43.1 41.1 38.4*  MCV 91.0 90.5 89.5 90.1  PLT 218 197 174 167   BMP &GFR Recent Labs  Lab 04/12/20 1728  NA 140  K 4.0  CL 106  CO2 24  GLUCOSE 103*  BUN 21*  CREATININE 1.02  CALCIUM 9.0   Estimated Creatinine Clearance: 80.5 mL/min (by C-G formula based on SCr of 1.02 mg/dL). Liver & Pancreas: Recent Labs  Lab 04/12/20 1728  AST 18  ALT 22  ALKPHOS 79  BILITOT 0.8  PROT 7.1  ALBUMIN 4.7   No results for input(s): LIPASE, AMYLASE in the last 168 hours. No results for input(s): AMMONIA in the last 168 hours. Diabetic: No results for input(s): HGBA1C in the last 72 hours. No results for input(s): GLUCAP in the last 168 hours. Cardiac Enzymes: No results for input(s): CKTOTAL, CKMB, CKMBINDEX, TROPONINI in the last 168 hours. No results for input(s): PROBNP in the last 8760 hours. Coagulation Profile: Recent Labs  Lab 04/12/20 1728 04/12/20 2029 04/13/20 0053 04/13/20 0536 04/13/20 0947  INR 0.9 1.1  1.1 1.2 1.1 1.2   Thyroid Function Tests: No results for input(s): TSH, T4TOTAL, FREET4, T3FREE, THYROIDAB in the last 72 hours. Lipid Profile: No results for input(s): CHOL, HDL, LDLCALC, TRIG, CHOLHDL, LDLDIRECT in the last 72 hours. Anemia Panel: No results for input(s): VITAMINB12, FOLATE, FERRITIN, TIBC, IRON, RETICCTPCT in the last 72 hours. Urine analysis:    Component Value Date/Time   COLORURINE Colorless 10/18/2012 1125   APPEARANCEUR Clear 10/18/2012 1125   LABSPEC 1.025 10/18/2012 1125   PHURINE 6.0 10/18/2012 1125   GLUCOSEU Negative 10/18/2012 1125    HGBUR 2+ 10/18/2012 1125   BILIRUBINUR Negative 10/18/2012 1125   KETONESUR Negative 10/18/2012 1125   PROTEINUR Negative 10/18/2012 1125   NITRITE Negative 10/18/2012 1125   LEUKOCYTESUR Negative 10/18/2012 1125   Sepsis Labs: Invalid input(s): PROCALCITONIN, LACTICIDVEN  Microbiology: Recent Results (from the past 240 hour(s))  SARS Coronavirus 2 by RT PCR (hospital order, performed in [facility] hospital lab) Nasopharyngeal Nasopharyngeal Swab     Status: None   Collection Time: 04/12/20  6:30 PM   Specimen: Nasopharyngeal Swab  Result Value Ref Range Status   SARS Coronavirus 2 NEGATIVE NEGATIVE Final    Comment: (NOTE) SARS-CoV-2 target nucleic acids are NOT DETECTED.  The SARS-CoV-2 RNA is generally detectable in upper and lower respiratory specimens during  the acute phase of infection. The lowest concentration of SARS-CoV-2 viral copies this assay can detect is 250 copies / mL. A negative result does not preclude SARS-CoV-2 infection and should not be used as the sole basis for treatment or other patient management decisions.  A negative result may occur with improper specimen collection / handling, submission of specimen other than nasopharyngeal swab, presence of viral mutation(s) within the areas targeted by this assay, and inadequate number of viral copies (<250 copies / mL). A negative result must be combined with clinical observations, patient history, and epidemiological information.  Fact Sheet for Patients:   BoilerBrush.com.cy  Fact Sheet for Healthcare Providers: https://pope.com/  This test is not yet approved or  cleared by the [place] FDA and has been authorized for detection and/or diagnosis of SARS-CoV-2 by FDA under an Emergency Use Authorization (EUA).  This EUA will remain in effect (meaning this test can be used) for the duration of the COVID-19 declaration under Section 564(b)(1) of the Act,  21 U.S.C. section 360bbb-3(b)(1), unless the authorization is terminated or revoked sooner.  Performed at [facility], [address] [place], [place] [address]     Radiology Studies: No results found.   Timo Hartwig T. Rossie Scarfone [facility]  If 7PM-7AM, please contact night-coverage [email or web address] Password [email or web address] 04/13/2020, 2:26 PM

## 2020-04-13 NOTE — Progress Notes (Signed)
Patient reported his arm is now tender to touch and that the swelling is going up his R wrist.

## 2020-04-13 NOTE — Progress Notes (Signed)
Called Poison control center. Spoke to [place] regarding the new swelling. Was told to elevate arm and keep straight. Will continue to monitor the patient for any additional swelling.  Will also monitor for any signs of bleeding.

## 2020-04-13 NOTE — Progress Notes (Signed)
Ch visited with Pt as part of routine rounding. Pt's significant other Misty Stanley present at the beginning of visit. Pt shared with Ch about how snake-bite happened, and showed photos of snake and gradual swellings. 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 Pt is surprised about biting after doing this for all these years. Pt shared with that this variety of snake does not have anti-venom. Pt a little anxious about outcome. But hopes and believes that he will not be adversely affected because the snake bit a rat before that, and most of the venom should've been dispersed to the rat. Pt asked for prayer. Ch prayed with Pt for healing, strength and peace. Pt was grateful for visit. Pt told Ch that he will try to sleep.

## 2020-04-13 NOTE — Progress Notes (Signed)
This nurse calls poison control to give update of current condition and labs. Speaks with Christy.  Patient has visible swelling to hand that extends to elbow. Recent fibrinogen 105 which is down from 141 @ 1452. This nurse instructed to continue to have patient elevate arm at 120 degrees and to continue to monitor labs as ordered. Patient reeducated on importance of keeping arm elevated. Voices understanding. Currently resting in bed with arm elevated, will continue to monitor to ensure comfort and safety.

## 2020-04-13 NOTE — Progress Notes (Signed)
RN called lab because pt's coag labs did not result at 1445 when they were drawn. Lab resulted them at the time of my call around 1815. Pt's fibrinogen dropped to 141 from 181 previously. Pt is still not experiencing any bleeding. He does have edema to right hand/arm but does not appear to be any worse than earlier today. Poison Control Center notified of lab change per their request (spoke with Verlon Au this morning). They stated they would notify their toxicologist and if there needs to be any changes made in the plan of care that they would call back after shift change.

## 2020-04-14 LAB — COMPREHENSIVE METABOLIC PANEL
ALT: 18 U/L (ref 0–44)
AST: 16 U/L (ref 15–41)
Albumin: 3.5 g/dL (ref 3.5–5.0)
Alkaline Phosphatase: 53 U/L (ref 38–126)
Anion gap: 8 (ref 5–15)
BUN: 19 mg/dL (ref 6–20)
CO2: 23 mmol/L (ref 22–32)
Calcium: 8.2 mg/dL — ABNORMAL LOW (ref 8.9–10.3)
Chloride: 109 mmol/L (ref 98–111)
Creatinine, Ser: 0.62 mg/dL (ref 0.61–1.24)
GFR calc Af Amer: >60 mL/min (ref >60)
GFR calc non Af Amer: >60 mL/min (ref >60)
Glucose, Bld: 91 mg/dL (ref 70–99)
Potassium: 3.9 mmol/L (ref 3.5–5.1)
Sodium: 140 mmol/L (ref 135–145)
Total Bilirubin: 0.9 mg/dL (ref 0.3–1.2)
Total Protein: 5.5 g/dL — ABNORMAL LOW (ref 6.5–8.1)

## 2020-04-14 LAB — CBC
HCT: 37.3 % — ABNORMAL LOW (ref 39.0–52.0)
HCT: 39.7 % (ref 39.0–52.0)
HCT: 37.9 % — ABNORMAL LOW (ref 39.0–52.0)
HCT: 38.6 % — ABNORMAL LOW (ref 39.0–52.0)
Hemoglobin: 12.6 g/dL — ABNORMAL LOW (ref 13.0–17.0)
Hemoglobin: 13.7 g/dL (ref 13.0–17.0)
Hemoglobin: 12.7 g/dL — ABNORMAL LOW (ref 13.0–17.0)
Hemoglobin: 13.1 g/dL (ref 13.0–17.0)
MCH: 31.5 pg (ref 26.0–34.0)
MCH: 31.3 pg (ref 26.0–34.0)
MCH: 31.4 pg (ref 26.0–34.0)
MCH: 31.5 pg (ref 26.0–34.0)
MCHC: 33.8 g/dL (ref 30.0–36.0)
MCHC: 34.5 g/dL (ref 30.0–36.0)
MCHC: 33.5 g/dL (ref 30.0–36.0)
MCHC: 33.9 g/dL (ref 30.0–36.0)
MCV: 93.3 fL (ref 80.0–100.0)
MCV: 90.6 fL (ref 80.0–100.0)
MCV: 93.6 fL (ref 80.0–100.0)
MCV: 92.8 fL (ref 80.0–100.0)
Platelets: 165 10*3/uL (ref 150–400)
Platelets: 173 10*3/uL (ref 150–400)
Platelets: 175 10*3/uL (ref 150–400)
Platelets: 170 10*3/uL (ref 150–400)
RBC: 4 MIL/uL — ABNORMAL LOW (ref 4.22–5.81)
RBC: 4.38 MIL/uL (ref 4.22–5.81)
RBC: 4.05 MIL/uL — ABNORMAL LOW (ref 4.22–5.81)
RBC: 4.16 MIL/uL — ABNORMAL LOW (ref 4.22–5.81)
RDW: 13.1 % (ref 11.5–15.5)
RDW: 13.2 % (ref 11.5–15.5)
RDW: 13.1 % (ref 11.5–15.5)
RDW: 13.1 % (ref 11.5–15.5)
WBC: 5.3 10*3/uL (ref 4.0–10.5)
WBC: 5.9 10*3/uL (ref 4.0–10.5)
WBC: 5.3 10*3/uL (ref 4.0–10.5)
WBC: 4.8 10*3/uL (ref 4.0–10.5)
nRBC: 0 % (ref 0.0–0.2)
nRBC: 0 % (ref 0.0–0.2)
nRBC: 0 % (ref 0.0–0.2)
nRBC: 0 % (ref 0.0–0.2)

## 2020-04-14 LAB — PROTIME-INR
INR: 1.3 — ABNORMAL HIGH (ref 0.8–1.2)
INR: 1.3 — ABNORMAL HIGH (ref 0.8–1.2)
INR: 1.4 — ABNORMAL HIGH (ref 0.8–1.2)
INR: 1.3 — ABNORMAL HIGH (ref 0.8–1.2)
Prothrombin Time: 15.5 seconds — ABNORMAL HIGH (ref 11.4–15.2)
Prothrombin Time: 15.9 seconds — ABNORMAL HIGH (ref 11.4–15.2)
Prothrombin Time: 16.5 seconds — ABNORMAL HIGH (ref 11.4–15.2)
Prothrombin Time: 15.8 seconds — ABNORMAL HIGH (ref 11.4–15.2)

## 2020-04-14 LAB — FIBRINOGEN
Fibrinogen: 66 mg/dL — CL (ref 210–475)
Fibrinogen: 60 mg/dL — CL (ref 210–475)
Fibrinogen: 60 mg/dL — CL (ref 210–475)
Fibrinogen: 60 mg/dL — CL (ref 210–475)

## 2020-04-14 LAB — APTT
aPTT: 29 seconds (ref 24–36)
aPTT: 27 seconds (ref 24–36)
aPTT: 29 seconds (ref 24–36)
aPTT: 29 seconds (ref 24–36)

## 2020-04-14 NOTE — Plan of Care (Signed)
  Problem: Coping: Goal: Level of anxiety will decrease Outcome: Not Progressing  Patient voices concerns r/t lab work as well as increasing of swelling to RUE.

## 2020-04-14 NOTE — Progress Notes (Signed)
Eileen Stanford RN from [employer] control called this [employer] fund[employer]. Suggestions for pt care: continue getting labs q6h, Monitor for bleeding, if any signs of bleeding give poison control a call back and will possibly give plasma. Poison control is continuing to look for correct antivenom treatment.

## 2020-04-14 NOTE — Progress Notes (Signed)
Lab notifies this nurse of critical fibrinogen result of 66. Poison control notified at this time to update on most recent results. Awaiting response.  This nurse also notifies NP Ouma.

## 2020-04-14 NOTE — Progress Notes (Signed)
This nurse speaks with pharmacist Caryn Bee at poison control. Most recent labs as well as vital signs reviewed. Caryn Bee states he will review labs with toxicologist and call back to unit with any new orders. Patient updated, no issues or concerns at this time. Will continue to monitor to ensure comfort and safety.

## 2020-04-14 NOTE — Progress Notes (Signed)
This nurse receives call back from poison control center r/t recent labs. This nurse instructed as of now no new orders . Continue with current treatment plan and labs as scheduled. If any signs of bleeding replenish with blood products. Caryn Bee states that similar case noted in [place] and that poison control is in contact to obtain treatment regimen to have on board if needed. Patient updated as well as NP Ouma. Will continue to monitor to ensue comfort and safety.

## 2020-04-14 NOTE — Progress Notes (Signed)
Pt laying in bed, pt was asleep when RN entered room. Pt's pain 3/10. Pt denies any further needs at this time. Call bell within reach

## 2020-04-14 NOTE — Progress Notes (Signed)
PROGRESS NOTE  Brandon SWEEDEN RJJ:[id number] DOB: Aug 03, 1961   PCP: Myrla Halsted, MD  Patient is from: Home  DOA: 04/12/2020 LOS: 2  Brief Narrative / Interim history: 59 year old male with history of A. 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  Subsequently, he developed hand swelling and pain.  Cardiac labs within normal range.  Poison control contacted by admitting provider and EDP.  Per poison control, patient will not respond to [employer].    Subjective: Seen and examined earlier this morning.  No major events overnight of this morning.  Complaints shoulder pain and sharp pain in his right hand.  Rates his pain 8/10.  Swelling somewhat better.  He is upset about not getting his pain medication on time.  Objective: Vitals:   04/13/20 0726 04/13/20 1558 04/13/20 2349 04/14/20 0731  BP: 108/80 122/85 121/86 116/84  Pulse: 79 72 74 70  Resp: 16 16 18 18   Temp: 98 F (36.7 C) 97.9 F (36.6 C) 98.2 F (36.8 C) 98.2 F (36.8 C)  TempSrc: Oral Oral Oral Oral  SpO2: 95% 93% 95% 96%  Weight:      Height:        Intake/Output Summary (Last 24 hours) at 04/14/2020 1318 Last data filed at 04/14/2020 0400 Gross per 24 hour  Intake 2291.51 ml  Output --  Net 2291.51 ml   Filed Weights   04/12/20 1720 04/12/20 2040  Weight: 81.6 kg 83.7 kg    Examination:  GENERAL: No apparent distress.  Nontoxic. HEENT: MMM.  Vision and hearing grossly intact.  NECK: Supple.  No apparent JVD.  RESP:  No IWOB.  Fair aeration bilaterally. CVS:  RRR. Heart sounds normal.  ABD/GI/GU: BS+. Abd soft, NTND.  MSK/EXT:  Moves extremities.  Right hand and right forearm swelling (better than yesterday).  No signs of compartment syndrome. SKIN: no apparent skin lesion or wound NEURO: Awake, alert and oriented appropriately.  No apparent focal neuro deficit. PSYCH: Calm. Normal affect.  Procedures:  None  Microbiology  summarized: COVID-19 PCR negative.  Assessment & Plan: Snakebite to right hand by African Bush viper.  Reportedly, no specific antivenom although through poison control hepatologist and toxicologist have been contacted.  There seems to be suggestion of an anti venom that may be considered.  "Samr echiscarinatus antivenom" available only at [facility], the closest one to [facility] being in [place].  There is a contact person named [place] who is the hematologist that have access to 5 vials of the antivenom.  Her telephone number is [phone] or cell phone [phone]. Recommendation is if INR appears to be getting worse we will obtain these vials to be on the safe side and administer to the patient.  If we do not use it we will then send it back to the hepatologist.  So far, CBC and coag labs stable.  Fibrinogen trended from 385>>> 60.  No evidence of bleeding. -Per poison control, -Continue trending coag labs, fibrinogen and CBC every 6 hours -Monitor clinically for increased swelling, bleeding -FFP if evidence of significant coagulopathy or bleeding -Pain control -Continue hand/arm elevation.  Paroxysmal A. fib: Tiadylt ER 120 mg, flecainide and low-dose aspirin at home. -Continue home Tiadylt ER 120 mg and flecainide. -Hold home aspirin   Body mass index is 26.49 kg/m.         DVT prophylaxis:  SCDs Start: 04/12/20 2022  Code  Status: Full code Family Communication: Patient's wife at bedside on 8/4.  None at bedside today. Status is: Inpatient  Remains inpatient appropriate because:Ongoing diagnostic testing needed not appropriate for outpatient work up, Inpatient level of care appropriate due to severity of illness and Close monitoring for coagulopathy and swelling   Dispo: The patient is from: Home              Anticipated d/c is to: Home              Anticipated d/c date is: 1 day              Patient currently is not medically stable to  d/c.       Consultants:  Poison control   Sch Meds:  Scheduled Meds: . diltiazem  120 mg Oral Daily   Continuous Infusions:  PRN Meds:.HYDROmorphone (DILAUDID) injection, morphine injection, ondansetron (ZOFRAN) IV  Antimicrobials: Anti-infectives (From admission, onward)   None       I have personally reviewed the following labs and images: CBC: Recent Labs  Lab 04/12/20 1728 04/12/20 1728 04/12/20 2029 04/12/20 2029 04/13/20 0053 04/13/20 0053 04/13/20 0537 04/13/20 1452 04/13/20 2016 04/14/20 0342 04/14/20 0910  WBC 7.3   < > 8.7   < > 12.1*   < > 8.6 6.4 7.5 5.3 5.9  NEUTROABS 5.1  --  7.6  --  10.6*  --   --   --   --   --   --   HGB 15.6   < > 15.1   < > 14.7   < > 13.7 13.1 13.4 12.6* 13.7  HCT 45.4   < > 43.1   < > 41.1   < > 38.4* 38.8* 39.4 37.3* 39.7  MCV 91.0   < > 90.5   < > 89.5   < > 90.1 92.6 93.1 93.3 90.6  PLT 218   < > 197   < > 174   < > 167 166 170 165 173   < > = values in this interval not displayed.   BMP &GFR Recent Labs  Lab 04/12/20 1728 04/14/20 0342  NA 140 140  K 4.0 3.9  CL 106 109  CO2 24 23  GLUCOSE 103* 91  BUN 21* 19  CREATININE 1.02 0.62  CALCIUM 9.0 8.2*   Estimated Creatinine Clearance: 102.7 mL/min (by C-G formula based on SCr of 0.62 mg/dL). Liver & Pancreas: Recent Labs  Lab 04/12/20 1728 04/14/20 0342  AST 18 16  ALT 22 18  ALKPHOS 79 53  BILITOT 0.8 0.9  PROT 7.1 5.5*  ALBUMIN 4.7 3.5   No results for input(s): LIPASE, AMYLASE in the last 168 hours. No results for input(s): AMMONIA in the last 168 hours. Diabetic: No results for input(s): HGBA1C in the last 72 hours. No results for input(s): GLUCAP in the last 168 hours. Cardiac Enzymes: No results for input(s): CKTOTAL, CKMB, CKMBINDEX, TROPONINI in the last 168 hours. No results for input(s): PROBNP in the last 8760 hours. Coagulation Profile: Recent Labs  Lab 04/13/20 0947 04/13/20 1452 04/13/20 2016 04/14/20 0342 04/14/20 0910   INR 1.2 1.2 1.2 1.3* 1.3*   Thyroid Function Tests: No results for input(s): TSH, T4TOTAL, FREET4, T3FREE, THYROIDAB in the last 72 hours. Lipid Profile: No results for input(s): CHOL, HDL, LDLCALC, TRIG, CHOLHDL, LDLDIRECT in the last 72 hours. Anemia Panel: No results for input(s): VITAMINB12, FOLATE, FERRITIN, TIBC, IRON, RETICCTPCT in the last 72 hours. Urine analysis:  Component Value Date/Time   COLORURINE Colorless 10/18/2012 1125   APPEARANCEUR Clear 10/18/2012 1125   LABSPEC 1.025 10/18/2012 1125   PHURINE 6.0 10/18/2012 1125   GLUCOSEU Negative 10/18/2012 1125   HGBUR 2+ 10/18/2012 1125   BILIRUBINUR Negative 10/18/2012 1125   KETONESUR Negative 10/18/2012 1125   PROTEINUR Negative 10/18/2012 1125   NITRITE Negative 10/18/2012 1125   LEUKOCYTESUR Negative 10/18/2012 1125   Sepsis Labs: Invalid input(s): PROCALCITONIN, LACTICIDVEN  Microbiology: Recent Results (from the past 240 hour(s))  SARS Coronavirus 2 by RT PCR (hospital order, performed in [facility] hospital lab) Nasopharyngeal Nasopharyngeal Swab     Status: None   Collection Time: 04/12/20  6:30 PM   Specimen: Nasopharyngeal Swab  Result Value Ref Range Status   SARS Coronavirus 2 NEGATIVE NEGATIVE Final    Comment: (NOTE) SARS-CoV-2 target nucleic acids are NOT DETECTED.  The SARS-CoV-2 RNA is generally detectable in upper and lower respiratory specimens during the acute phase of infection. The lowest concentration of SARS-CoV-2 viral copies this assay can detect is 250 copies / mL. A negative result does not preclude SARS-CoV-2 infection and should not be used as the sole basis for treatment or other patient management decisions.  A negative result may occur with improper specimen collection / handling, submission of specimen other than nasopharyngeal swab, presence of viral mutation(s) within the areas targeted by this assay, and inadequate number of viral copies (<250 copies / mL). A negative  result must be combined with clinical observations, patient history, and epidemiological information.  Fact Sheet for Patients:   BoilerBrush.com.cy  Fact Sheet for Healthcare Providers: https://pope.com/  This test is not yet approved or  cleared by the [place] FDA and has been authorized for detection and/or diagnosis of SARS-CoV-2 by FDA under an Emergency Use Authorization (EUA).  This EUA will remain in effect (meaning this test can be used) for the duration of the COVID-19 declaration under Section 564(b)(1) of the Act, 21 U.S.C. section 360bbb-3(b)(1), unless the authorization is terminated or revoked sooner.  Performed at [facility], [address] [place], [place] [address]     Radiology Studies: No results found.   Michaelene Dutan T. Albie Bazin [facility]  If 7PM-7AM, please contact night-coverage [email or web address] Password [email or web address] 04/14/2020, 1:18 PM

## 2020-04-14 NOTE — Plan of Care (Signed)
  Problem: Clinical Measurements: Goal: Cardiovascular complication will be avoided Outcome: Progressing   Problem: Coping: Goal: Level of anxiety will decrease Outcome: Progressing   

## 2020-04-14 NOTE — Progress Notes (Signed)
Lab updates this nurse of recent fibrinogen of 60. This value is the same as last resulted. Patient updated of result, no complaints at this time. Will continue to monitor to ensure comfort and safety.

## 2020-04-14 NOTE — Progress Notes (Signed)
Pt 8/10 pain. RN gave PRN dilaudid. Pt denies any further needs at this time. Call bell within reach and RN number on pt's board.

## 2020-04-14 NOTE — Progress Notes (Signed)
This RN called poison control and spoke with Nurse, mental health. Per Poison control continue to monitor labs Q6h an monitor for bleeding. If bleeding suspected or evident call poison control and give plasma. RN notified Alanda Slim MD.

## 2020-04-14 NOTE — Plan of Care (Signed)
  Problem: Education: Goal: Knowledge of General Education information will improve Description: Including pain rating scale, medication(s)/side effects and non-pharmacologic comfort measures Outcome: Progressing   Problem: Clinical Measurements: Goal: Will remain free from infection Outcome: Progressing   Problem: Activity: Goal: Risk for activity intolerance will decrease Outcome: Progressing   Problem: Nutrition: Goal: Adequate nutrition will be maintained Outcome: Progressing   Problem: Coping: Goal: Level of anxiety will decrease Outcome: Progressing   Problem: Elimination: Goal: Will not experience complications related to bowel motility Outcome: Progressing Goal: Will not experience complications related to urinary retention Outcome: Progressing   Problem: Pain Managment: Goal: General experience of comfort will improve Outcome: Progressing   Problem: Safety: Goal: Ability to remain free from injury will improve Outcome: Progressing

## 2020-04-14 NOTE — Progress Notes (Signed)
RN called Kerby Moors at [employer] and updated CBC and Fibrinogen lab results. Kerby Moors will call this RN back with further suggestions. Pt VS wdl, no signs of acute distress and pt's pain 3/10

## 2020-04-15 DIAGNOSIS — K5903 Drug induced constipation: Secondary | ICD-10-CM

## 2020-04-15 LAB — CBC
HCT: 37.7 % — ABNORMAL LOW (ref 39.0–52.0)
HCT: 39.4 % (ref 39.0–52.0)
Hemoglobin: 13.3 g/dL (ref 13.0–17.0)
Hemoglobin: 13.9 g/dL (ref 13.0–17.0)
MCH: 31.7 pg (ref 26.0–34.0)
MCH: 32 pg (ref 26.0–34.0)
MCHC: 35.3 g/dL (ref 30.0–36.0)
MCHC: 35.3 g/dL (ref 30.0–36.0)
MCV: 89.8 fL (ref 80.0–100.0)
MCV: 90.8 fL (ref 80.0–100.0)
Platelets: 166 10*3/uL (ref 150–400)
Platelets: 165 10*3/uL (ref 150–400)
RBC: 4.2 MIL/uL — ABNORMAL LOW (ref 4.22–5.81)
RBC: 4.34 MIL/uL (ref 4.22–5.81)
RDW: 12.9 % (ref 11.5–15.5)
RDW: 12.9 % (ref 11.5–15.5)
WBC: 5.5 10*3/uL (ref 4.0–10.5)
WBC: 6.4 10*3/uL (ref 4.0–10.5)
nRBC: 0 % (ref 0.0–0.2)
nRBC: 0 % (ref 0.0–0.2)

## 2020-04-15 LAB — PROTIME-INR
INR: 1.4 — ABNORMAL HIGH (ref 0.8–1.2)
INR: 1.3 — ABNORMAL HIGH (ref 0.8–1.2)
Prothrombin Time: 16.2 seconds — ABNORMAL HIGH (ref 11.4–15.2)
Prothrombin Time: 15.2 seconds (ref 11.4–15.2)

## 2020-04-15 LAB — APTT
aPTT: 30 seconds (ref 24–36)
aPTT: 28 seconds (ref 24–36)

## 2020-04-15 LAB — FIBRINOGEN
Fibrinogen: 60 mg/dL — CL (ref 210–475)
Fibrinogen: 73 mg/dL — CL (ref 210–475)

## 2020-04-15 MED ORDER — SENNOSIDES-DOCUSATE SODIUM 8.6-50 MG PO TABS: 1.0000 | ORAL_TABLET | Freq: Two times a day (BID) | ORAL | Status: DC | PRN

## 2020-04-15 MED ORDER — OXYCODONE HCL 5 MG PO TABS: 5.0000 mg | ORAL_TABLET | Freq: Four times a day (QID) | ORAL | 0 refills | Status: AC | PRN

## 2020-04-15 MED ORDER — OXYCODONE HCL 5 MG PO TABS
5.0000 mg | ORAL_TABLET | Freq: Four times a day (QID) | ORAL | Status: DC | PRN
  Administered 2020-04-15: 5 mg via ORAL
  Filled 2020-04-15: qty 1

## 2020-04-15 MED ORDER — POLYETHYLENE GLYCOL 3350 17 G PO PACK: 17.0000 g | PACK | Freq: Two times a day (BID) | ORAL | Status: DC | PRN

## 2020-04-15 MED ORDER — ACETAMINOPHEN 325 MG PO TABS: 650.0000 mg | ORAL_TABLET | Freq: Four times a day (QID) | ORAL | Status: DC | PRN

## 2020-04-15 NOTE — Progress Notes (Signed)
Pt sleeping when RN walked in room. Pt 8/10 pain , PRN dilaudid given. Pt going to shower. Tele box removed. Pt to call RN when done showering. Pt denies any further needs at this time. Call bell within reach and RN and Tech numbers on pt's board.

## 2020-04-15 NOTE — Discharge Instructions (Signed)
Snake Bite Snake bite is an injury to the skin or the deeper tissues beneath the skin that is caused by a snake. There are two types of snakes: poisonous (venomous) and nonpoisonous (nonvenomous). A nonvenomous snake will cause a wound. A venomous snake will cause a wound and may also inject poison (venom) into the wound. The effects of snake venom vary depending on the type of snake. In some cases, the effects can be extremely serious or even deadly. A bite from a venomous snake is a medical emergency. Treatment may require the use of antivenom medicine. What are the causes? This injury is caused by a venomous snake or a nonvenomous snake. What increases the risk? You are more likely to get a snake bite if:  You walk or hike in outdoor areas.  You do not cover your arms and legs with clothing when hiking.  You provoke or try to pick up a snake. What are the signs or symptoms? Symptoms of a snake bite vary depending on the type of snake, whether the snake is venomous, and the severity of the bite.  Symptoms for both a venomous or nonvenomous snake may include:  Pain, redness, and swelling at the site of the bite.  Skin discoloration at the site of the bite.  A feeling of nervousness. Symptoms of a venomous snake bite may also include:  Increasing pain and swelling.  Severe anxiety or confusion.  Blood blisters or purple spots in the bite area.  Nausea and vomiting.  Numbness or tingling.  Muscle weakness.  Excessive fatigue or drowsiness.  Excessive sweating.  Difficulty breathing.  Blurred vision.  Bruising and bleeding at the site of the bite.  Feeling faint or light-headed. In some cases, symptoms do not develop until a few hours after the bite. How is this diagnosed? This condition may be diagnosed based on symptoms and a physical exam. Your health care provider will examine the bite area and ask for details about the snake to help determine whether it is  venomous. You may also have tests, including blood tests. How is this treated? Treatment depends on the severity of the bite and whether the snake is venomous.  Treatment for nonvenomous snake bites may include basic wound care. This includes cleaning the wound and applying a bandage (dressing).  Treatment for venomous snake bites may include antivenom medicine in addition to wound care. This medicine needs to be given as soon as possible after the bite. Other treatments may be needed to help control symptoms as they develop. You may need to stay in a hospital so your condition can be monitored. Your health care provider may prescribe antibiotic medicine to avoid infection in the wound. You may need a tetanus shot if it has been more than 5 years since you had one. Follow these instructions at home: Wound care   Follow instructions from your health care provider about how to take care of your wound. Make sure you: ? Wash your hands with soap and water before you change your dressing. If soap and water are not available, use hand sanitizer. ? Change your dressing as told by your health care provider.  Keep the wound clean and dry. Wash the wound daily with soap and water or a germ-killing (antiseptic) soap as told by your health care provider.  Check your wound every day for signs of infection. Watch for: ? Redness, swelling, or pain that is getting worse. ? Warmth. ? Fluid, blood, or pus.  If you   develop blistering at the site of the bite, protect the blisters from breaking. Do not attempt to open a blister.  Do not take baths, swim, or use a hot tub until your health care provider approves. Ask your health care provider if you may take showers. You may only be allowed to take sponge baths. Medicines  Take or apply over-the-counter and prescription medicines only as told by your health care provider.  If you were prescribed an antibiotic medicine, take or apply it as told by your  health care provider. Do not stop using the antibiotic even if your condition improves. General instructions  If possible, keep the affected area raised (elevated) above the level of your heart while you are sitting or lying down.  Keep all follow-up visits as told by your health care provider. This is important. Contact a health care provider if you have:  Increased redness, swelling, or pain at the site of your wound.  Fluid, blood, or pus coming from your wound.  A fever. Get help right away if:  You develop blood blisters or purple spots in the bite area.  You have: ? Nausea or vomiting. ? Numbness or tingling. ? Excessive sweating. ? Trouble breathing. ? Trouble seeing.  You feel very confused.  You feel faint or light-headed. Summary  Snake bite is an injury to the skin or the deeper tissues beneath the skin that is caused by a snake.  A nonvenomous snake will cause a wound. A venomous snake will cause a wound and may also inject poison (venom) into the wound.  The effects of snake venom vary depending on the type of snake.  Treatment depends on the severity of the bite and whether the snake is venomous. You may require antivenom or antibiotic medicine after a snake bite. This information is not intended to replace advice given to you by your health care provider. Make sure you discuss any questions you have with your health care provider. Document Revised: 02/12/2018 Document Reviewed: 02/12/2018 Elsevier Patient Education  2020 [employer].  Taking other medicines  Take over-the-counter and prescriptions medicines only as told by your health care provider.  Do not take over-the-counter NSAIDs, including aspirin and ibuprofen, while you are on anticoagulant therapy. These medicines increase your risk of dangerous bleeding.  Get approval from your health care provider before you start taking any new medicines, vitamins, or herbal products. Some of these could  interfere with your therapy.  What precautions should I take?  1. Be very careful when using knives, scissors, or other sharp objects. 2. Use an electric razor instead of a blade. 3. Do not use toothpicks. 4. Use a soft-bristled toothbrush. Brush your teeth gently. 5. Always wear shoes outdoors and wear slippers indoors. 6. Be careful when cutting your fingernails and toenails. 7. Place bath mats in the bathroom. If possible, install handrails as well. 8. Wear gloves while you do yard work. 9. Wear your seat belt. 10. Prevent falls by removing loose rugs and extension cords from areas where you walk. Use a cane or walker if you need it. 11. Avoid constipation by: ? Drinking enough fluid to keep your urine clear or pale yellow. ? Eating foods that are high in fiber, such as fresh fruits and vegetables, whole grains, and beans. ? Limiting foods that are high in fat and processed sugars, such as fried and sweet foods. 12. Do not play contact sports or participate in other activities that have a high risk for injury.  Get help right away if: 1. You have bleeding that will not stop within 20 minutes from: ? The nose. ? The gums. ? A cut on the skin. 2. You have a severe headache or stomachache. 3. You vomit or cough up blood. 4. You fall or hit your head.

## 2020-04-15 NOTE — Progress Notes (Signed)
PROGRESS NOTE  Brandon Patton GUY:[id number] DOB: 05-18-1961   PCP: Myrla Halsted, MD  Patient is from: Home  DOA: 04/12/2020 LOS: 3  Brief Narrative / Interim history: 59 year old male with history of A. 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  Subsequently, he developed hand swelling and pain.  Cardiac labs within normal range.  Poison control contacted by admitting provider and EDP.  Per poison control, patient will not respond to [employer].   Subjective: Seen and examined earlier this morning.  No major events overnight of this morning.  Continues to endorse significant pain in his right hand.  Pain has improved and the rest of his right arm.  No evidence of bleeding.  Coag labs stable.  Fibrinogen down to 60 but stable.  Objective: Vitals:   04/14/20 0731 04/14/20 1610 04/14/20 2326 04/15/20 0827  BP: 116/84 108/87 110/77 123/82  Pulse: 70 74 66 88  Resp: 18 18 18    Temp: 98.2 F (36.8 C) 98.4 F (36.9 C) 98.2 F (36.8 C) 98.1 F (36.7 C)  TempSrc: Oral Oral Oral Oral  SpO2: 96% 95% 96% 95%  Weight:      Height:        Intake/Output Summary (Last 24 hours) at 04/15/2020 1209 Last data filed at 04/15/2020 1028 Gross per 24 hour  Intake 480 ml  Output --  Net 480 ml   Filed Weights   04/12/20 1720 04/12/20 2040  Weight: 81.6 kg 83.7 kg    Examination:  GENERAL: No apparent distress.  Nontoxic. HEENT: MMM.  Vision and hearing grossly intact.  NECK: Supple.  No apparent JVD.  RESP:  No IWOB.  Fair aeration bilaterally. CVS:  RRR. Heart sounds normal.  ABD/GI/GU: BS+. Abd soft, NTND.  MSK/EXT:  Moves extremities.  Improved swelling in RUE.  Able to make a partial fist but anxious to flex his index finger. SKIN: no apparent skin lesion or wound NEURO: Awake, alert and oriented appropriately.  No apparent focal neuro deficit. PSYCH: Calm. Normal affect.  Procedures:  None  Microbiology  summarized: COVID-19 PCR negative.  Assessment & Plan: Snakebite to right hand by African Bush viper.  Reportedly, no specific antivenom although through poison control hepatologist and toxicologist have been contacted.  There seems to be suggestion of an anti venom that may be considered.  "Samr echiscarinatus antivenom" available only at [facility], the closest one to [facility] being in [place].  There is a contact person named [place] who is the hematologist that have access to 5 vials of the antivenom.  Her telephone number is [phone] or cell phone [phone]. Recommendation is if INR appears to be getting worse we will obtain these vials to be on the safe side and administer to the patient.  If we do not use it we will then send it back to the hepatologist.  So far, CBC and coag labs stable.  Fibrinogen trended from 385>>> 60>>60.  No evidence of bleeding. -Per poison control, -Continue trending coag labs, fibrinogen and CBC every 6 hours -Monitor clinically for increased swelling, bleeding -Cryo if evidence of significant coagulopathy or bleeding -Pain control -Continue hand/arm elevation.  Paroxysmal A. fib: Tiadylt ER 120 mg, flecainide and low-dose aspirin at home. -Continue home Tiadylt ER 120 mg and flecainide. -Hold home aspirin  Constipation: Likely opiate induced. -MiraLAX and Senokot-S as needed Body mass index is 26.49 kg/m.  DVT prophylaxis:  SCDs Start: 04/12/20 2022  Code Status: Full code Family Communication: Patient's wife at bedside Status is: Inpatient  Remains inpatient appropriate because:Ongoing diagnostic testing needed not appropriate for outpatient work up, Inpatient level of care appropriate due to severity of illness and Close monitoring for coagulopathy and swelling   Dispo: The patient is from: Home              Anticipated d/c is to: Home              Anticipated d/c date is: 1 day              Patient currently is  not medically stable to d/c.       Consultants:  Poison control   Sch Meds:  Scheduled Meds: . diltiazem  120 mg Oral Daily   Continuous Infusions:  PRN Meds:.HYDROmorphone (DILAUDID) injection, morphine injection, ondansetron (ZOFRAN) IV, polyethylene glycol, senna-docusate  Antimicrobials: Anti-infectives (From admission, onward)   None       I have personally reviewed the following labs and images: CBC: Recent Labs  Lab 04/12/20 1728 04/12/20 1728 04/12/20 2029 04/12/20 2029 04/13/20 0053 04/13/20 0537 04/14/20 0910 04/14/20 1451 04/14/20 2049 04/15/20 0602 04/15/20 1148  WBC 7.3   < > 8.7   < > 12.1*   < > 5.9 5.3 4.8 5.5 6.4  NEUTROABS 5.1  --  7.6  --  10.6*  --   --   --   --   --   --   HGB 15.6   < > 15.1   < > 14.7   < > 13.7 12.7* 13.1 13.3 13.9  HCT 45.4   < > 43.1   < > 41.1   < > 39.7 37.9* 38.6* 37.7* 39.4  MCV 91.0   < > 90.5   < > 89.5   < > 90.6 93.6 92.8 89.8 90.8  PLT 218   < > 197   < > 174   < > 173 175 170 166 165   < > = values in this interval not displayed.   BMP &GFR Recent Labs  Lab 04/12/20 1728 04/14/20 0342  NA 140 140  K 4.0 3.9  CL 106 109  CO2 24 23  GLUCOSE 103* 91  BUN 21* 19  CREATININE 1.02 0.62  CALCIUM 9.0 8.2*   Estimated Creatinine Clearance: 102.7 mL/min (by C-G formula based on SCr of 0.62 mg/dL). Liver & Pancreas: Recent Labs  Lab 04/12/20 1728 04/14/20 0342  AST 18 16  ALT 22 18  ALKPHOS 79 53  BILITOT 0.8 0.9  PROT 7.1 5.5*  ALBUMIN 4.7 3.5   No results for input(s): LIPASE, AMYLASE in the last 168 hours. No results for input(s): AMMONIA in the last 168 hours. Diabetic: No results for input(s): HGBA1C in the last 72 hours. No results for input(s): GLUCAP in the last 168 hours. Cardiac Enzymes: No results for input(s): CKTOTAL, CKMB, CKMBINDEX, TROPONINI in the last 168 hours. No results for input(s): PROBNP in the last 8760 hours. Coagulation Profile: Recent Labs  Lab 04/14/20 0342  04/14/20 0910 04/14/20 1451 04/14/20 2049 04/15/20 0602  INR 1.3* 1.3* 1.4* 1.3* 1.4*   Thyroid Function Tests: No results for input(s): TSH, T4TOTAL, FREET4, T3FREE, THYROIDAB in the last 72 hours. Lipid Profile: No results for input(s): CHOL, HDL, LDLCALC, TRIG, CHOLHDL, LDLDIRECT in the last 72 hours. Anemia Panel: No results for input(s): VITAMINB12, FOLATE, FERRITIN, TIBC, IRON, RETICCTPCT in the last  72 hours. Urine analysis:    Component Value Date/Time   COLORURINE Colorless 10/18/2012 1125   APPEARANCEUR Clear 10/18/2012 1125   LABSPEC 1.025 10/18/2012 1125   PHURINE 6.0 10/18/2012 1125   GLUCOSEU Negative 10/18/2012 1125   HGBUR 2+ 10/18/2012 1125   BILIRUBINUR Negative 10/18/2012 1125   KETONESUR Negative 10/18/2012 1125   PROTEINUR Negative 10/18/2012 1125   NITRITE Negative 10/18/2012 1125   LEUKOCYTESUR Negative 10/18/2012 1125   Sepsis Labs: Invalid input(s): PROCALCITONIN, LACTICIDVEN  Microbiology: Recent Results (from the past 240 hour(s))  SARS Coronavirus 2 by RT PCR (hospital order, performed in [facility] hospital lab) Nasopharyngeal Nasopharyngeal Swab     Status: None   Collection Time: 04/12/20  6:30 PM   Specimen: Nasopharyngeal Swab  Result Value Ref Range Status   SARS Coronavirus 2 NEGATIVE NEGATIVE Final    Comment: (NOTE) SARS-CoV-2 target nucleic acids are NOT DETECTED.  The SARS-CoV-2 RNA is generally detectable in upper and lower respiratory specimens during the acute phase of infection. The lowest concentration of SARS-CoV-2 viral copies this assay can detect is 250 copies / mL. A negative result does not preclude SARS-CoV-2 infection and should not be used as the sole basis for treatment or other patient management decisions.  A negative result may occur with improper specimen collection / handling, submission of specimen other than nasopharyngeal swab, presence of viral mutation(s) within the areas targeted by this assay, and  inadequate number of viral copies (<250 copies / mL). A negative result must be combined with clinical observations, patient history, and epidemiological information.  Fact Sheet for Patients:   BoilerBrush.com.cy  Fact Sheet for Healthcare Providers: https://pope.com/  This test is not yet approved or  cleared by the [place] FDA and has been authorized for detection and/or diagnosis of SARS-CoV-2 by FDA under an Emergency Use Authorization (EUA).  This EUA will remain in effect (meaning this test can be used) for the duration of the COVID-19 declaration under Section 564(b)(1) of the Act, 21 U.S.C. section 360bbb-3(b)(1), unless the authorization is terminated or revoked sooner.  Performed at [facility], [address], [place], [place] [address]     Radiology Studies: No results found.   Archana Eckman T. Kaile Bixler [facility]  If 7PM-7AM, please contact night-coverage [email or web address] Password [email or web address] 04/15/2020, 12:09 PM

## 2020-04-15 NOTE — Plan of Care (Signed)
  Problem: Education: Goal: Knowledge of General Education information will improve Description: Including pain rating scale, medication(s)/side effects and non-pharmacologic comfort measures 04/15/2020 1808 by Meyer Cory, RN Outcome: Progressing 04/15/2020 1105 by Meyer Cory, RN Outcome: Progressing   Problem: Health Behavior/Discharge Planning: Goal: Ability to manage health-related needs will improve 04/15/2020 1808 by Meyer Cory, RN Outcome: Progressing 04/15/2020 1105 by Meyer Cory, RN Outcome: Progressing   Problem: Clinical Measurements: Goal: Ability to maintain clinical measurements within normal limits will improve 04/15/2020 1808 by Meyer Cory, RN Outcome: Progressing 04/15/2020 1105 by Meyer Cory, RN Outcome: Progressing Goal: Will remain free from infection 04/15/2020 1808 by Meyer Cory, RN Outcome: Progressing 04/15/2020 1105 by Meyer Cory, RN Outcome: Progressing Goal: Diagnostic test results will improve 04/15/2020 1808 by Meyer Cory, RN Outcome: Progressing 04/15/2020 1105 by Meyer Cory, RN Outcome: Progressing Goal: Cardiovascular complication will be avoided 04/15/2020 1808 by Meyer Cory, RN Outcome: Progressing 04/15/2020 1105 by Meyer Cory, RN Outcome: Progressing   Problem: Activity: Goal: Risk for activity intolerance will decrease 04/15/2020 1808 by Meyer Cory, RN Outcome: Progressing 04/15/2020 1105 by Meyer Cory, RN Outcome: Progressing   Problem: Nutrition: Goal: Adequate nutrition will be maintained 04/15/2020 1808 by Meyer Cory, RN Outcome: Progressing 04/15/2020 1105 by Meyer Cory, RN Outcome: Progressing   Problem: Coping: Goal: Level of anxiety will decrease 04/15/2020 1808 by Meyer Cory, RN Outcome: Progressing 04/15/2020 1105 by Meyer Cory, RN Outcome: Progressing   Problem: Elimination: Goal: Will not experience complications related to bowel motility 04/15/2020 1808 by Meyer Cory,  RN Outcome: Progressing 04/15/2020 1105 by Meyer Cory, RN Outcome: Progressing Goal: Will not experience complications related to urinary retention 04/15/2020 1808 by Meyer Cory, RN Outcome: Progressing 04/15/2020 1105 by Meyer Cory, RN Outcome: Progressing   Problem: Pain Managment: Goal: General experience of comfort will improve Outcome: Progressing   Problem: Safety: Goal: Ability to remain free from injury will improve Outcome: Progressing   Problem: Skin Integrity: Goal: Risk for impaired skin integrity will decrease Outcome: Progressing

## 2020-04-15 NOTE — Discharge Summary (Signed)
Physician Discharge Summary  Brandon BlitzMark V Patton ZOX:[id number] DOB: 29-Sep-1960 DOA: 04/12/2020  PCP: Brandon Patton  Admit date: 04/12/2020 Discharge date: 04/15/2020  Admitted From: Home Disposition: Home  Recommendations for Outpatient Follow-up:  1. Follow ups as below. 2. Please obtain CBC/CMP/APTT/PT/INR/fibrinogen at follow up 3. Please follow up on the following pending results: None  Home Health: None Equipment/Devices: None  Discharge Condition: Stable CODE STATUS: Full code   Follow-up Information    Brandon Patton. Schedule an appointment as soon as possible for a visit in 1 week(s).   Specialty: Family Medicine Contact information: [address] [place] [address] [phone]               Hospital Course: 59 year old male with history of A. 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  Subsequently, he developed hand swelling and pain.  Cardiac labs within normal range.  Poison control contacted by admitting provider and EDP.  He was monitored with serial coag labs which were stable except for fibrinogen that has trended from 385-60 4 8 improved to 73 on the day of discharge.  Hand pain and swelling improved as well.  He was cleared for discharge by poison control toxicologist.  He will follow up with PCP next week to have repeat labs as above.  See individual problem list below for more on hospital course.  Discharge Diagnoses:  Snakebite to right hand by African Bush viper.  Reportedly, no specific antivenom per poison control hematologist and toxicologist.  Hand swelling and pain improved. CBC and coag labs remained stable.  Fibrinogen trended dropped from 385-60 before it trended up to 73.  Cleared by poison control toxicologist -Oxycodone 5 mg every 6 hours as needed for severe pain, #12 -Recommended getting Senokot-S for constipation -Encouraged to seek care if  easy bruising, signs of bleeding or other symptoms concerning -Outpatient follow-up with PCP next week for CBC/CMP/coag labs  Paroxysmal A. fib: Tiadylt ER 120 mg, flecainide and low-dose aspirin at home.  CHA2DS2-VASc score 0. -Continue home Tiadylt ER 120 mg and flecainide. -Discontinued aspirin  Constipation: Likely opiate induced.  Resolved. -Encouraged to get Senokot-S over-the-counter   Body mass index is 26.49 kg/m.            Discharge Exam: Vitals:   04/15/20 0827 04/15/20 1532  BP: 123/82 108/74  Pulse: 88 73  Resp:  16  Temp: 98.1 F (36.7 C) 98 F (36.7 C)  SpO2: 95% 97%    GENERAL: No apparent distress.  Nontoxic. HEENT: MMM.  Vision and hearing grossly intact.  NECK: Supple.  No apparent JVD.  RESP:  No IWOB.  Fair aeration bilaterally. CVS:  RRR. Heart sounds normal.  ABD/GI/GU: Bowel sounds present. Soft. Non tender.  MSK/EXT:  Moves extremities.  Minimal residual swelling of right hand.  No erythema or signs of infection. SKIN: no apparent skin lesion or wound NEURO: Awake, alert and oriented appropriately.  No apparent focal neuro deficit. PSYCH: Calm. Normal affect.  Discharge Instructions  Discharge Instructions    Call Patton for:  difficulty breathing, headache or visual disturbances   Complete by: As directed    Call Patton for:  extreme fatigue   Complete by: As directed    Call Patton for:  persistant dizziness or light-headedness   Complete by: As directed    Call Patton for:  persistant nausea and vomiting  Complete by: As directed    Call Patton for:  redness, tenderness, or signs of infection (pain, swelling, redness, odor or green/yellow discharge around incision site)   Complete by: As directed    Call Patton for:  severe uncontrolled pain   Complete by: As directed    Call Patton for:  temperature >100.4   Complete by: As directed    Diet general   Complete by: As directed    Discharge instructions   Complete by: As directed    It has been a  pleasure taking care of you!  You were hospitalized due to snakebite.  You have been monitored during this hospitalization.  Your lab results are now stable for discharge and follow up with your primary care doctor.  Please follow-up with your primary care doctor to have repeat lab draws.  Avoid aspirin or any over-the-counter pain medication until you see your primary care doctor.  Seek medical care if signs of excessive bruising or bleeding.   We may have started you on other new medications or made some changes to your home medications during this hospitalization. Please review your new medication list and the directions carefully before you take them.    Please go to your hospital follow-up appointments or call to reschedule as recommended.   Take care,   Increase activity slowly   Complete by: As directed      Allergies as of 04/15/2020      Reactions   Penicillins Hives, Rash   Sulfa Antibiotics Other (See Comments), Hives, Rash   blisters Other Reaction: Fever      Medication List    STOP taking these medications   aspirin 81 MG chewable tablet     TAKE these medications   atorvastatin 40 MG tablet Commonly known as: LIPITOR Take 40 mg by mouth daily.   flecainide 50 MG tablet Commonly known as: TAMBOCOR Take 50 mg by mouth every 8 (eight) hours as needed.   Multi-Vitamin tablet Take 1 tablet by mouth daily.   oxyCODONE 5 MG immediate release tablet Commonly known as: Oxy IR/ROXICODONE Take 1 tablet (5 mg total) by mouth every 6 (six) hours as needed for up to 3 days for moderate pain.   tadalafil 20 MG tablet Commonly known as: CIALIS Take 20 mg by mouth daily as needed for erectile dysfunction.   Tiadylt ER 120 MG 24 hr capsule Generic drug: diltiazem Take 120 mg by mouth daily.       Consultations:  Poison control  Procedures/Studies:    No results found.     The results of significant diagnostics from this hospitalization (including  imaging, microbiology, ancillary and laboratory) are listed below for reference.     Microbiology: Recent Results (from the past 240 hour(s))  SARS Coronavirus 2 by RT PCR (hospital order, performed in [facility] hospital lab) Nasopharyngeal Nasopharyngeal Swab     Status: None   Collection Time: 04/12/20  6:30 PM   Specimen: Nasopharyngeal Swab  Result Value Ref Range Status   SARS Coronavirus 2 NEGATIVE NEGATIVE Final    Comment: (NOTE) SARS-CoV-2 target nucleic acids are NOT DETECTED.  The SARS-CoV-2 RNA is generally detectable in upper and lower respiratory specimens during the acute phase of infection. The lowest concentration of SARS-CoV-2 viral copies this assay can detect is 250 copies / mL. A negative result does not preclude SARS-CoV-2 infection and should not be used as the sole basis for treatment or other patient management decisions.  A negative  result may occur with improper specimen collection / handling, submission of specimen other than nasopharyngeal swab, presence of viral mutation(s) within the areas targeted by this assay, and inadequate number of viral copies (<250 copies / mL). A negative result must be combined with clinical observations, patient history, and epidemiological information.  Fact Sheet for Patients:   BoilerBrush.com.cy  Fact Sheet for Healthcare Providers: https://pope.com/  This test is not yet approved or  cleared by the [place] FDA and has been authorized for detection and/or diagnosis of SARS-CoV-2 by FDA under an Emergency Use Authorization (EUA).  This EUA will remain in effect (meaning this test can be used) for the duration of the COVID-19 declaration under Section 564(b)(1) of the Act, 21 U.S.C. section 360bbb-3(b)(1), unless the authorization is terminated or revoked sooner.  Performed at [facility], [address] [place], [place] [address]      Labs: BNP  (last 3 results) No results for input(s): BNP in the last 8760 hours. Basic Metabolic Panel: Recent Labs  Lab 04/12/20 1728 04/14/20 0342  NA 140 140  K 4.0 3.9  CL 106 109  CO2 24 23  GLUCOSE 103* 91  BUN 21* 19  CREATININE 1.02 0.62  CALCIUM 9.0 8.2*   Liver Function Tests: Recent Labs  Lab 04/12/20 1728 04/14/20 0342  AST 18 16  ALT 22 18  ALKPHOS 79 53  BILITOT 0.8 0.9  PROT 7.1 5.5*  ALBUMIN 4.7 3.5   No results for input(s): LIPASE, AMYLASE in the last 168 hours. No results for input(s): AMMONIA in the last 168 hours. CBC: Recent Labs  Lab 04/12/20 1728 04/12/20 1728 04/12/20 2029 04/12/20 2029 04/13/20 0053 04/13/20 0537 04/14/20 0910 04/14/20 1451 04/14/20 2049 04/15/20 0602 04/15/20 1148  WBC 7.3   < > 8.7   < > 12.1*   < > 5.9 5.3 4.8 5.5 6.4  NEUTROABS 5.1  --  7.6  --  10.6*  --   --   --   --   --   --   HGB 15.6   < > 15.1   < > 14.7   < > 13.7 12.7* 13.1 13.3 13.9  HCT 45.4   < > 43.1   < > 41.1   < > 39.7 37.9* 38.6* 37.7* 39.4  MCV 91.0   < > 90.5   < > 89.5   < > 90.6 93.6 92.8 89.8 90.8  PLT 218   < > 197   < > 174   < > 173 175 170 166 165   < > = values in this interval not displayed.   Cardiac Enzymes: No results for input(s): CKTOTAL, CKMB, CKMBINDEX, TROPONINI in the last 168 hours. BNP: Invalid input(s): POCBNP CBG: No results for input(s): GLUCAP in the last 168 hours. D-Dimer No results for input(s): DDIMER in the last 72 hours. Hgb A1c No results for input(s): HGBA1C in the last 72 hours. Lipid Profile No results for input(s): CHOL, HDL, LDLCALC, TRIG, CHOLHDL, LDLDIRECT in the last 72 hours. Thyroid function studies No results for input(s): TSH, T4TOTAL, T3FREE, THYROIDAB in the last 72 hours.  Invalid input(s): FREET3 Anemia work up No results for input(s): VITAMINB12, FOLATE, FERRITIN, TIBC, IRON, RETICCTPCT in the last 72 hours. Urinalysis    Component Value Date/Time   COLORURINE Colorless 10/18/2012 1125    APPEARANCEUR Clear 10/18/2012 1125   LABSPEC 1.025 10/18/2012 1125   PHURINE 6.0 10/18/2012 1125   GLUCOSEU Negative 10/18/2012 1125  HGBUR 2+ 10/18/2012 1125   BILIRUBINUR Negative 10/18/2012 1125   KETONESUR Negative 10/18/2012 1125   PROTEINUR Negative 10/18/2012 1125   NITRITE Negative 10/18/2012 1125   LEUKOCYTESUR Negative 10/18/2012 1125   Sepsis Labs Invalid input(s): PROCALCITONIN,  WBC,  LACTICIDVEN   Time coordinating discharge: 35 minutes  SIGNED:  Almon Hercules, Patton  [facility] 04/15/2020, 5:11 PM  If 7PM-7AM, please contact night-coverage [email or web address] Password [email or web address]

## 2020-04-15 NOTE — Progress Notes (Signed)
This nurse just spoke with poison control . Patient has no labs ordered. Initially ordered for 6 occurrences. Poison control recommends that labs be repeated now and then Q6 hours until improvement noted. NP Ouma notified and repeat lab orders requested. Awaiting response.

## 2020-04-15 NOTE — Progress Notes (Signed)
Pt IV removed. No signs of active bleeding. RN went of discharge summary and education. Pt denies any further questions at this time. Pt ready for discharge.

## 2020-04-15 NOTE — Progress Notes (Signed)
RN called Poison control and updated them with new VS and labs and assessment. Verlon Au RN with Poison Control will call back once MD gives recommendations on care.

## 2020-04-15 NOTE — Progress Notes (Signed)
RN called Dr. Alanda Slim with updates on labs and recommendations

## 2020-04-15 NOTE — Progress Notes (Signed)
Poison control called this RN back. Per toxicologist on call (Dr. Verlon Setting) Pt is able to discharge now as long as pt is responsible and careful and understands his high risk for bleeding  and will seek care immediately if he has bleeding or suspects bleeding. Pt would also need to have weekly labs arranged. Per toxicologist it may take weeks before fibrinogen levels are above 100,000. RN notified Attending of Poison controls call.

## 2020-04-15 NOTE — Progress Notes (Signed)
NP Ouma orders labwork per poison control recommendations.

## 2020-04-15 NOTE — Plan of Care (Signed)
  Problem: Education: Goal: Knowledge of General Education information will improve Description: Including pain rating scale, medication(s)/side effects and non-pharmacologic comfort measures Outcome: Progressing   Problem: Health Behavior/Discharge Planning: Goal: Ability to manage health-related needs will improve Outcome: Progressing   Problem: Clinical Measurements: Goal: Ability to maintain clinical measurements within normal limits will improve Outcome: Progressing Goal: Will remain free from infection Outcome: Progressing Goal: Diagnostic test results will improve Outcome: Progressing Goal: Cardiovascular complication will be avoided Outcome: Progressing   Problem: Activity: Goal: Risk for activity intolerance will decrease Outcome: Progressing   Problem: Nutrition: Goal: Adequate nutrition will be maintained Outcome: Progressing   Problem: Coping: Goal: Level of anxiety will decrease Outcome: Progressing   Problem: Elimination: Goal: Will not experience complications related to bowel motility Outcome: Progressing Goal: Will not experience complications related to urinary retention Outcome: Progressing   

## 2020-04-15 NOTE — Progress Notes (Signed)
Poison control called by this RN and updated with pt's labs. Per poison control continue to monitor labs q6h, monitor for bleeding. Poison control RN to call back later tonight with some follow up information.

## 2020-09-23 ENCOUNTER — Other Ambulatory Visit: Payer: Self-pay

## 2020-09-23 ENCOUNTER — Ambulatory Visit (INDEPENDENT_AMBULATORY_CARE_PROVIDER_SITE_OTHER): Payer: BC Managed Care – PPO

## 2020-09-23 ENCOUNTER — Ambulatory Visit
Admission: EM | Admit: 2020-09-23 | Discharge: 2020-09-23 | Disposition: A | Discharge status: Home / Self Care | Payer: BC Managed Care – PPO | Attending: Family Medicine | Admitting: Family Medicine

## 2020-09-23 ENCOUNTER — Encounter: Payer: Self-pay | Admitting: Emergency Medicine

## 2020-09-23 DIAGNOSIS — S59901A Unspecified injury of right elbow, initial encounter: Secondary | ICD-10-CM

## 2020-09-23 DIAGNOSIS — W19XXXA Unspecified fall, initial encounter: Secondary | ICD-10-CM

## 2020-09-23 DIAGNOSIS — S52124A Nondisplaced fracture of head of right radius, initial encounter for closed fracture: Secondary | ICD-10-CM

## 2020-09-23 MED ORDER — KETOROLAC TROMETHAMINE 10 MG PO TABS: 10.0000 mg | ORAL_TABLET | Freq: Four times a day (QID) | ORAL | 0 refills | Status: AC | PRN

## 2020-09-23 NOTE — ED Triage Notes (Signed)
Pt states that he fell backwards yesterday and he tried to break the fall with his right arm and landed right on his elbow. Pt states no lacerations just swelling.

## 2020-09-23 NOTE — ED Provider Notes (Signed)
[facility]    CSN: [id number] Arrival date & time: 09/23/20  1320      History   Chief Complaint Chief Complaint  Patient presents with  . Elbow Injury   HPI  60 year old male presents with the above complaint.  Patient states that he was trying to crush a box yesterday.  He slipped on the box and fell backwards.  He fell on an outstretched right arm.  Patient reports that he injured his elbow.  He has pain and swelling in the right elbow.  Pain with range of motion.  He is in a makeshift sling at this time.  Pain 5/10 in severity.  Pain is okay at rest and worse with activity.  No other reported symptoms.  No other complaints.  Past Medical History:  Diagnosis Date  . A-fib [facility])     Patient Active Problem List   Diagnosis Date Noted  . Bite, snake 04/12/2020  . A-fib (HCC) 04/12/2020    Past Surgical History:  Procedure Laterality Date  . APPENDECTOMY    . TMJ ARTHROPLASTY         Home Medications    Prior to Admission medications   Medication Sig Start Date End Date Taking? Authorizing Provider  ketorolac (TORADOL) 10 MG tablet Take 1 tablet (10 mg total) by mouth every 6 (six) hours as needed for moderate pain or severe pain. 09/23/20  Yes Shaquile Lutze G, DO  atorvastatin (LIPITOR) 40 MG tablet Take 40 mg by mouth daily. 01/22/20   [provider]  flecainide (TAMBOCOR) 50 MG tablet Take 50 mg by mouth every 8 (eight) hours as needed. 03/11/20   [provider]  Multiple Vitamin (MULTI-VITAMIN) tablet Take 1 tablet by mouth daily.    [provider]  tadalafil (CIALIS) 20 MG tablet Take 20 mg by mouth daily as needed for erectile dysfunction. 03/18/20   [provider]  TIADYLT ER 120 MG 24 hr capsule Take 120 mg by mouth daily. 01/22/20   [provider]    Family History Family History  Problem Relation Age of Onset  . Breast cancer Paternal Aunt   . Breast cancer Paternal Grandmother   . Breast cancer  Paternal Aunt     Social History Social History   Tobacco Use  . Smoking status: Never Smoker  . Smokeless tobacco: Never Used  Substance Use Topics  . Alcohol use: Never  . Drug use: Never     Allergies   Penicillins and Sulfa antibiotics   Review of Systems Review of Systems  Constitutional: Negative.   Musculoskeletal:       Right elbow pain, swelling.   Physical Exam Triage Vital Signs ED Triage Vitals  Enc Vitals Group     BP 09/23/20 1417 117/81     Pulse Rate 09/23/20 1417 88     Resp 09/23/20 1417 18     Temp 09/23/20 1417 98.1 F (36.7 C)     Temp Source 09/23/20 1417 Oral     SpO2 09/23/20 1417 97 %     Weight --      Height --      Head Circumference --      Peak Flow --      Pain Score 09/23/20 1416 5     Pain Loc --      Pain Edu? --      Excl. in GC? --    Updated Vital Signs BP 117/81 (BP Location: Left Arm)  Pulse 88   Temp 98.1 F (36.7 C) (Oral)   Resp 18   SpO2 97%   Visual Acuity Right Eye Distance:   Left Eye Distance:   Bilateral Distance:    Right Eye Near:   Left Eye Near:    Bilateral Near:     Physical Exam Vitals and nursing note reviewed.  Constitutional:      General: He is not in acute distress.    Appearance: Normal appearance. He is not ill-appearing.  HENT:     Head: Normocephalic and atraumatic.  Pulmonary:     Effort: Pulmonary effort is normal. No respiratory distress.  Musculoskeletal:     Comments: Right elbow -swelling noted.  Exquisite tenderness diffusely.  Neurological:     Mental Status: He is alert.  Psychiatric:        Mood and Affect: Mood normal.        Behavior: Behavior normal.    UC Treatments / Results  Labs (all labs ordered are listed, but only abnormal results are displayed) Labs Reviewed - No data to display  EKG   Radiology DG Elbow Complete Right  Result Date: 09/23/2020 CLINICAL DATA:  Fall, injury of the right elbow EXAM: RIGHT ELBOW - COMPLETE 3+ VIEW COMPARISON:   None. FINDINGS: There is a large elbow joint effusion. Mild irregularity of the junction of the radial head and neck noted anteriorly. Indistinct/effaced pronator fat pad. The anterior humeral line intersects the middle third of the capitellum. Small spur noted along the proximal lateral epicondyle. I not see a well-defined distal humeral fracture. The oblique view attempt is somewhat distorted due to obliquity with respect to the x-ray beam. IMPRESSION: 1. Large elbow joint effusion implying a probable fracture, but the only cortical discontinuity that is well seen is a subtle potential chip along the anterior border of the radial head and neck. Effaced pronator fat pad. The possibility of additional underlying occult fracture is raised and CT scan should be considered. Electronically Signed   By: Gaylyn Rong M.D.   On: 09/23/2020 15:18    Procedures Procedures (including critical care time)  Medications Ordered in UC Medications - No data to display  Initial Impression / Assessment and Plan / UC Course  I have reviewed the triage vital signs and the nursing notes.  Pertinent labs & imaging results that were available during my care of the patient were reviewed by me and considered in my medical decision making (see chart for details).    60 year old male presents with right elbow injury.  X-ray was obtained.  Independent interpretation: Large joint effusion.  Small radial head fracture noted.  Placed in sling.  Advised rest and ice.  Ketorolac as needed.  Advise follow-up with orthopedics.  May need further imaging.  Final Clinical Impressions(s) / UC Diagnoses   Final diagnoses:  Closed nondisplaced fracture of head of right radius, initial encounter     Discharge Instructions     Rest, ice.  Medication as prescribed.  Please call [facility] clinic Orthopedics [phone]) OR [facility] [phone]) for an appt.  Take care  Dr. Adriana Simas    ED Prescriptions    Medication  Sig Dispense Auth. Provider   ketorolac (TORADOL) 10 MG tablet Take 1 tablet (10 mg total) by mouth every 6 (six) hours as needed for moderate pain or severe pain. 20 tablet Tommie Sams, DO     PDMP not reviewed this encounter.   Tommie Sams, [place] 09/23/20 1639

## 2020-09-23 NOTE — Discharge Instructions (Signed)
Rest, ice.  Medication as prescribed.  Please call [facility] clinic Orthopedics [phone]) OR [facility] [phone]) for an appt.  Take care  Dr. Adriana Simas

## 2022-03-26 IMAGING — CR DG ELBOW COMPLETE 3+V*R*
3 series · 3 of 3 positions shown · non-contrast
Comparison: None.

CLINICAL DATA: Fall, injury of the right elbow

EXAM:
RIGHT ELBOW - COMPLETE 3+ VIEW

[elbow ap]
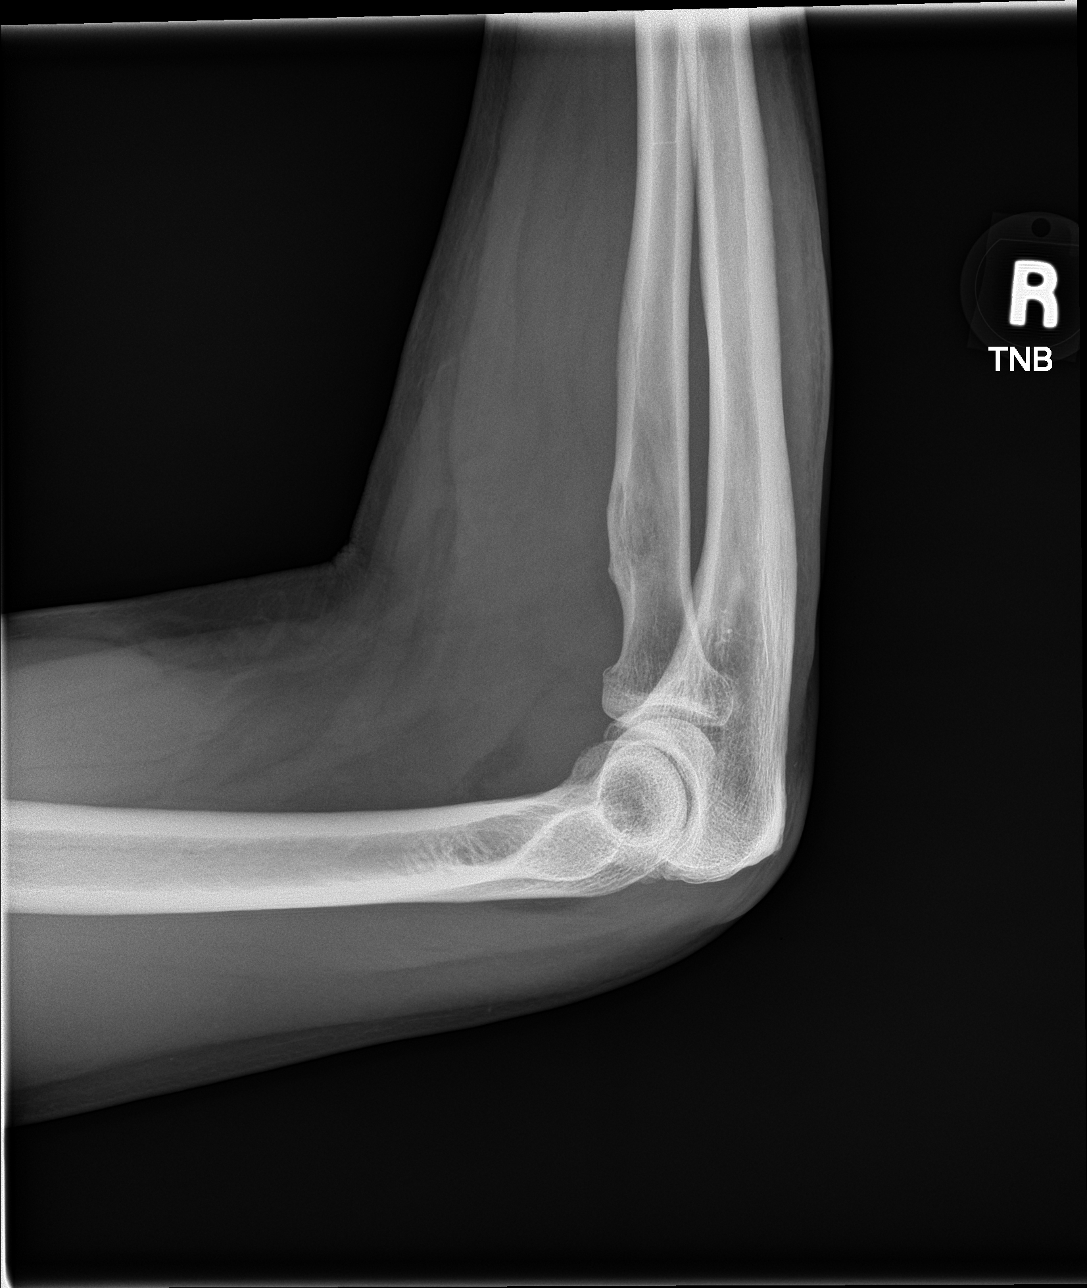

[elbow obl (1 of 2)]
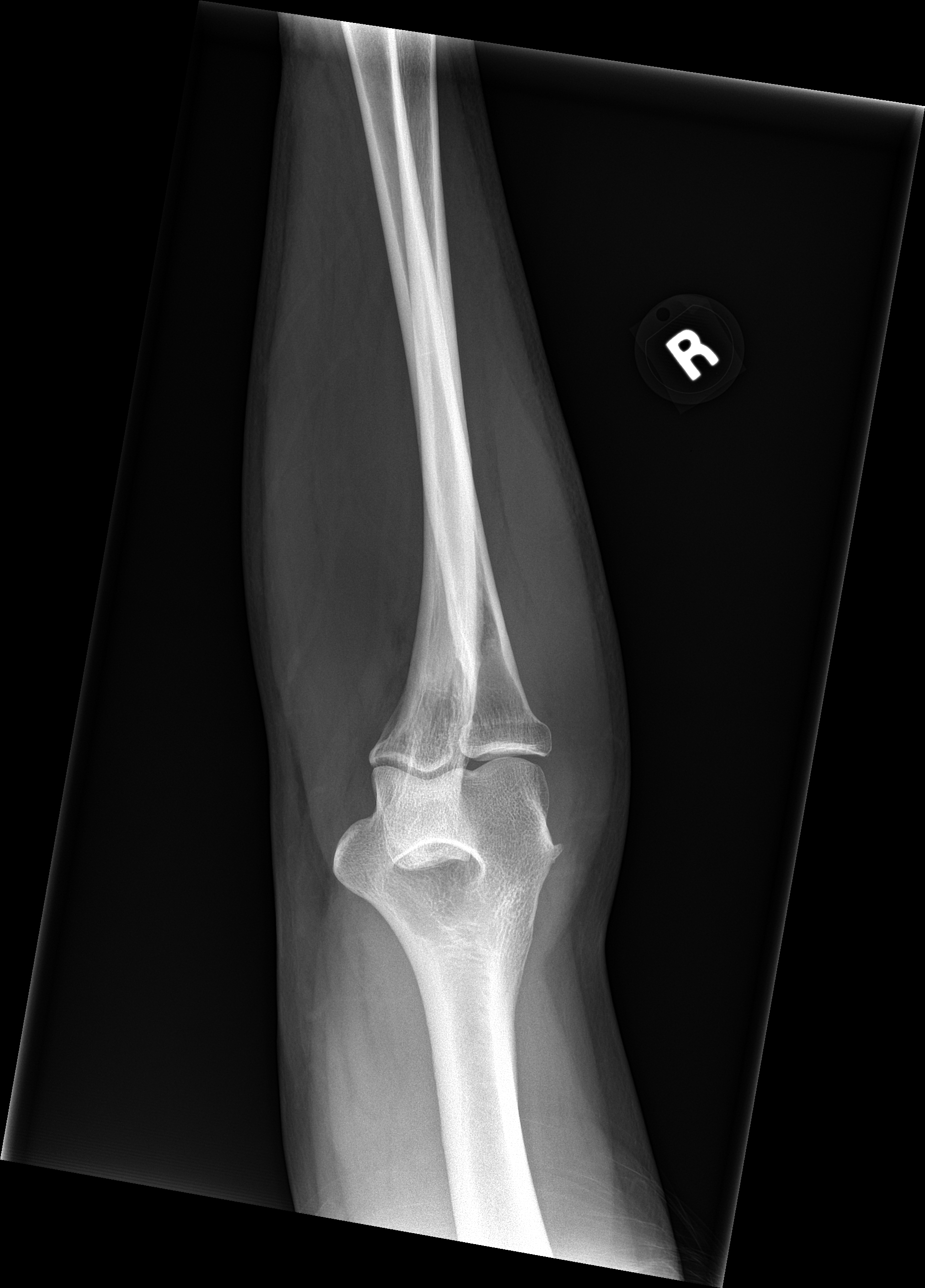

[elbow obl (2 of 2)]
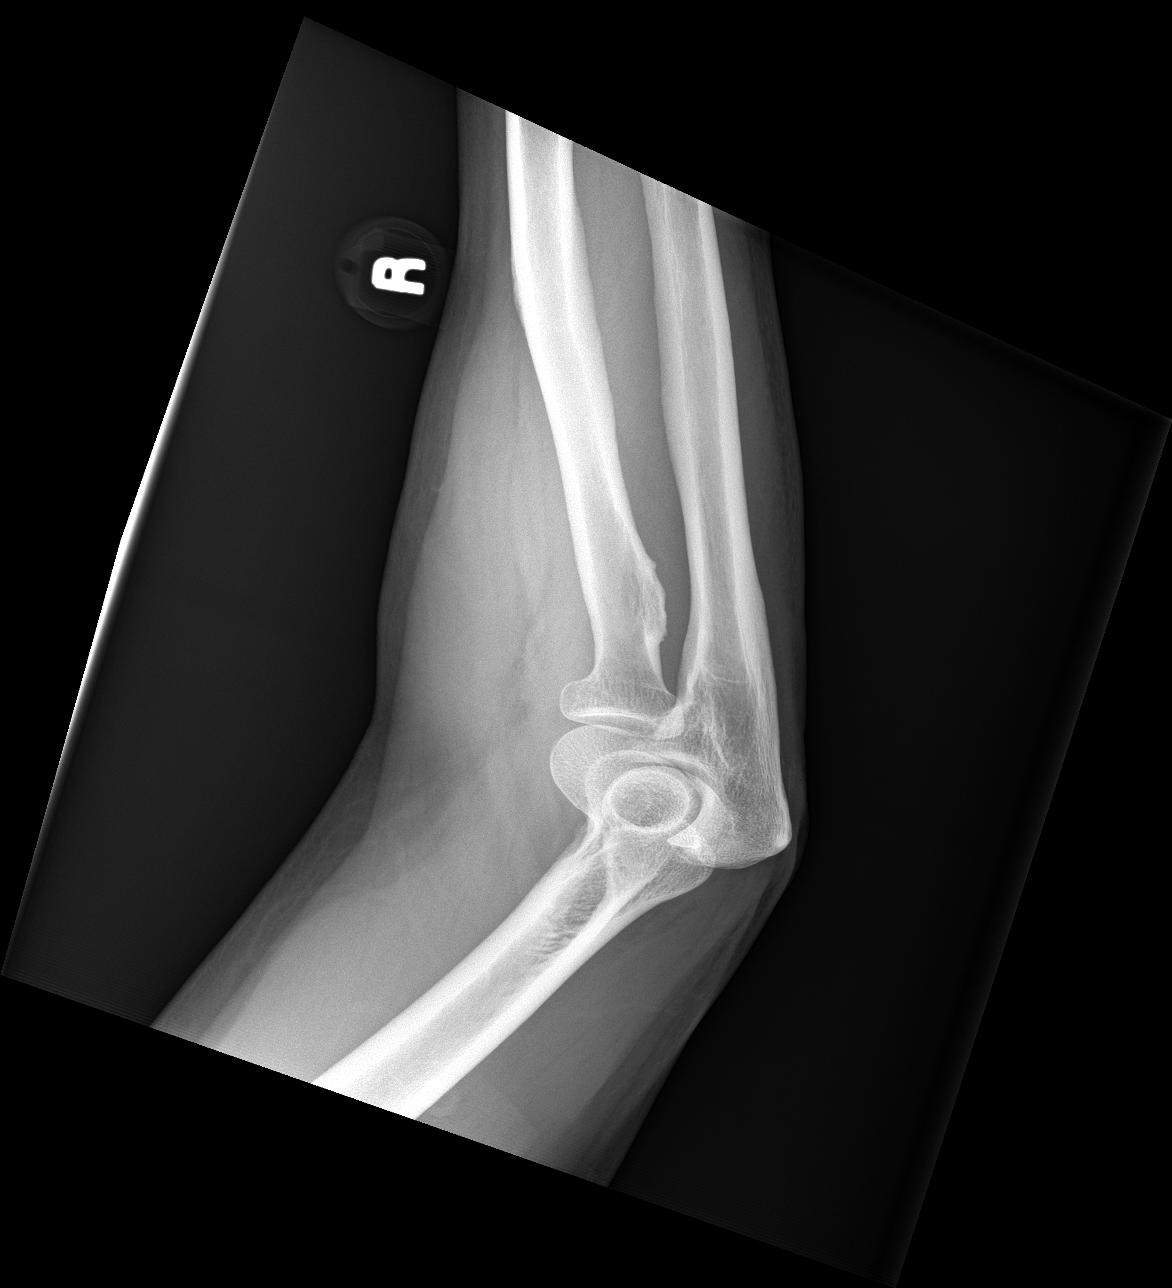

[3 of 3 positions shown; findings below may reference images not displayed]

FINDINGS: There is a large elbow joint effusion. Mild irregularity of the
junction of the radial head and neck noted anteriorly.
Indistinct/effaced pronator fat pad.

The anterior humeral line intersects the middle third of the
capitellum. Small spur noted along the proximal lateral epicondyle.
I not see a well-defined distal humeral fracture. The oblique view
attempt is somewhat distorted due to obliquity with respect to the
x-ray beam.
IMPRESSION: 1. Large elbow joint effusion implying a probable fracture, but the
only cortical discontinuity that is well seen is a subtle potential
chip along the anterior border of the radial head and neck. Effaced
pronator fat pad. The possibility of additional underlying occult
fracture is raised and CT scan should be considered.
